# Patient Record
Sex: Male | Born: 1940 | Race: White | Hispanic: No | Marital: Married | Smoking: Never smoker
Health system: Southern US, Community
[De-identification: ages and names within clinical notes are randomized; demographics above are authoritative.]

## PROBLEM LIST (undated history)

## (undated) DIAGNOSIS — E119 Type 2 diabetes mellitus without complications: Secondary | ICD-10-CM

## (undated) DIAGNOSIS — I10 Essential (primary) hypertension: Secondary | ICD-10-CM

## (undated) DIAGNOSIS — E785 Hyperlipidemia, unspecified: Secondary | ICD-10-CM

## (undated) DIAGNOSIS — C801 Malignant (primary) neoplasm, unspecified: Secondary | ICD-10-CM

## (undated) HISTORY — PX: TONSILLECTOMY: SUR1361

## (undated) HISTORY — PX: KNEE SURGERY: SHX244

---

## 1998-01-15 ENCOUNTER — Other Ambulatory Visit: Admission: RE | Admit: 1998-01-15 | Discharge: 1998-01-15 | Payer: Self-pay

## 2000-01-19 ENCOUNTER — Other Ambulatory Visit: Admission: RE | Admit: 2000-01-19 | Discharge: 2000-01-19 | Payer: Self-pay | Admitting: Gastroenterology

## 2000-02-02 ENCOUNTER — Encounter: Admission: RE | Admit: 2000-02-02 | Discharge: 2000-02-26 | Payer: Self-pay | Admitting: *Deleted

## 2004-08-16 DIAGNOSIS — C801 Malignant (primary) neoplasm, unspecified: Secondary | ICD-10-CM

## 2004-08-16 HISTORY — PX: SUPERFICIAL LYMPH NODE BIOPSY / EXCISION: SUR127

## 2004-08-16 HISTORY — DX: Malignant (primary) neoplasm, unspecified: C80.1

## 2005-02-05 ENCOUNTER — Ambulatory Visit: Payer: Self-pay | Admitting: Unknown Physician Specialty

## 2005-02-11 ENCOUNTER — Other Ambulatory Visit: Payer: Self-pay

## 2005-02-26 ENCOUNTER — Ambulatory Visit: Payer: Self-pay | Admitting: Unknown Physician Specialty

## 2005-03-04 ENCOUNTER — Ambulatory Visit: Payer: Self-pay | Admitting: Oncology

## 2005-03-16 ENCOUNTER — Ambulatory Visit: Payer: Self-pay | Admitting: Oncology

## 2005-04-16 ENCOUNTER — Ambulatory Visit: Payer: Self-pay | Admitting: Oncology

## 2005-05-16 ENCOUNTER — Ambulatory Visit: Payer: Self-pay | Admitting: Oncology

## 2005-06-18 ENCOUNTER — Ambulatory Visit: Payer: Self-pay | Admitting: Oncology

## 2005-07-16 ENCOUNTER — Ambulatory Visit: Payer: Self-pay | Admitting: Oncology

## 2005-10-18 ENCOUNTER — Ambulatory Visit: Payer: Self-pay | Admitting: Oncology

## 2005-11-14 ENCOUNTER — Ambulatory Visit: Payer: Self-pay | Admitting: Oncology

## 2006-02-15 ENCOUNTER — Ambulatory Visit: Payer: Self-pay | Admitting: Oncology

## 2006-03-16 ENCOUNTER — Ambulatory Visit: Payer: Self-pay | Admitting: Oncology

## 2006-05-06 ENCOUNTER — Ambulatory Visit: Payer: Self-pay | Admitting: Radiation Oncology

## 2006-08-15 ENCOUNTER — Ambulatory Visit: Payer: Self-pay | Admitting: Oncology

## 2006-08-16 ENCOUNTER — Ambulatory Visit: Payer: Self-pay | Admitting: Oncology

## 2007-01-15 ENCOUNTER — Ambulatory Visit: Payer: Self-pay | Admitting: Oncology

## 2007-02-10 ENCOUNTER — Ambulatory Visit: Payer: Self-pay | Admitting: Oncology

## 2007-02-14 ENCOUNTER — Ambulatory Visit: Payer: Self-pay | Admitting: Oncology

## 2007-03-19 IMAGING — CT CT NECK WITH CONTRAST
1 of 2 series · 10 of 14 positions shown, 13 images · non-contrast
Comparison: none

REASON FOR EXAM: Rt Submandibular Mass
COMMENTS:

[Series 2: neck 3.0 b40f · axial · 0.44mm/px · z∈[+8,+262]mm · 10 of 105 slices shown, 13 images]
[im 10/105  soft-tissue]
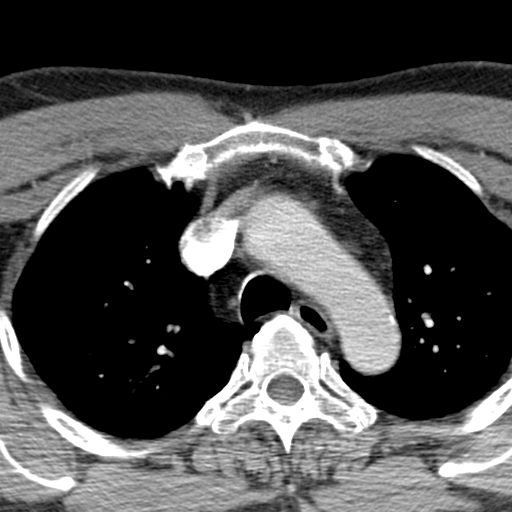
[im 10/105  bone]
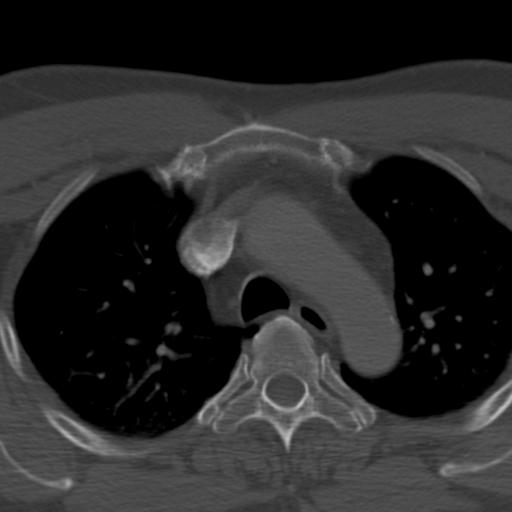
[im 19/105  bone]
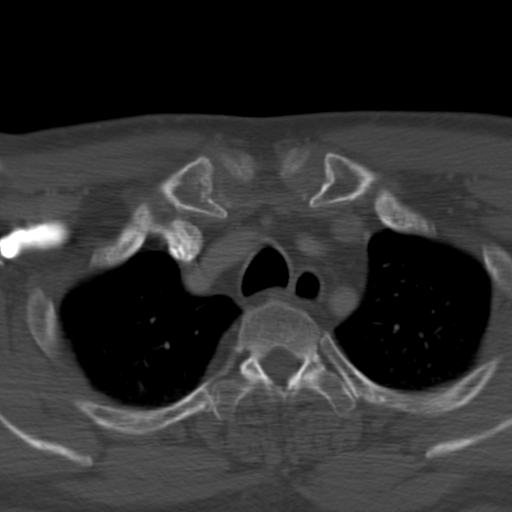
[im 29/105  bone]
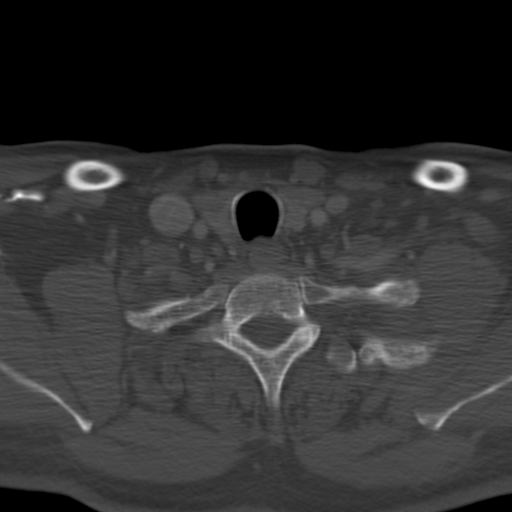
[im 38/105  bone]
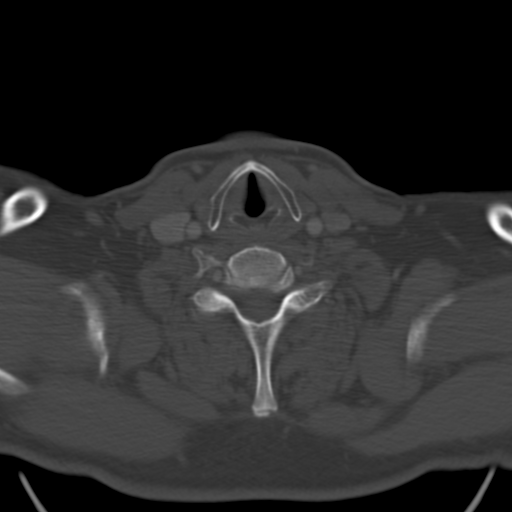
[im 48/105  soft-tissue]
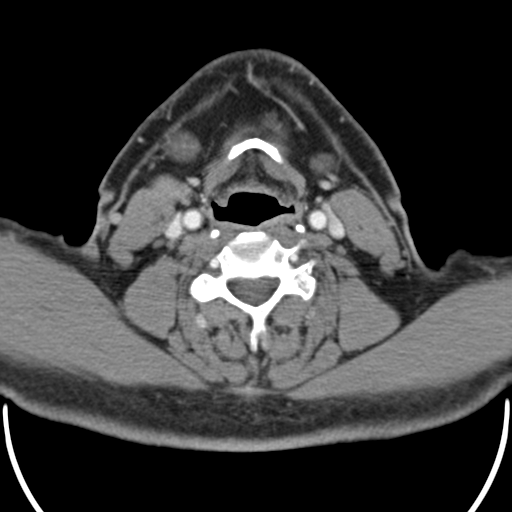
[im 48/105  bone]
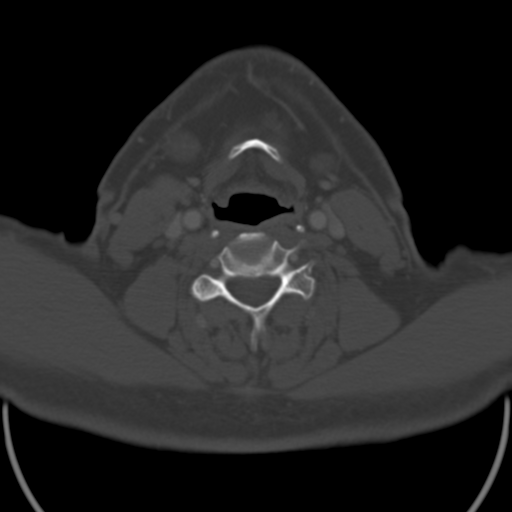
[im 57/105  bone]
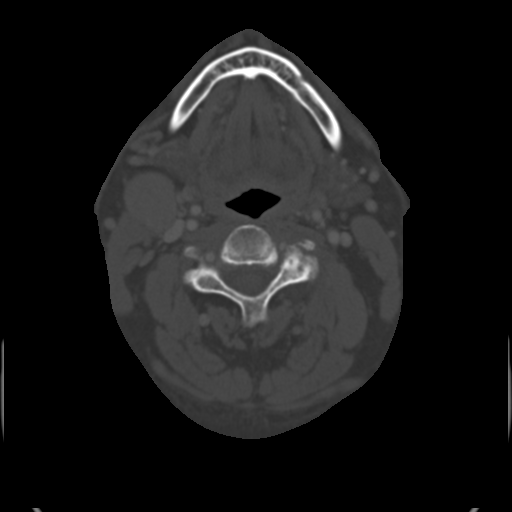
[im 67/105  bone]
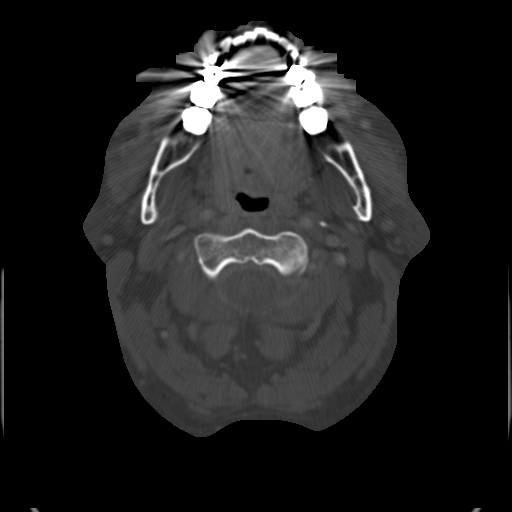
[im 76/105  bone]
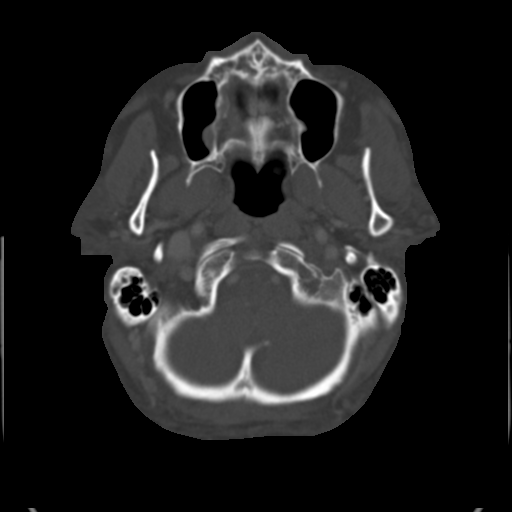
[im 86/105  soft-tissue]
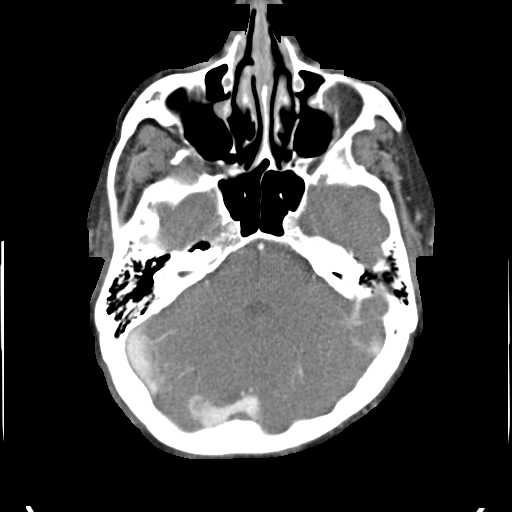
[im 86/105  bone]
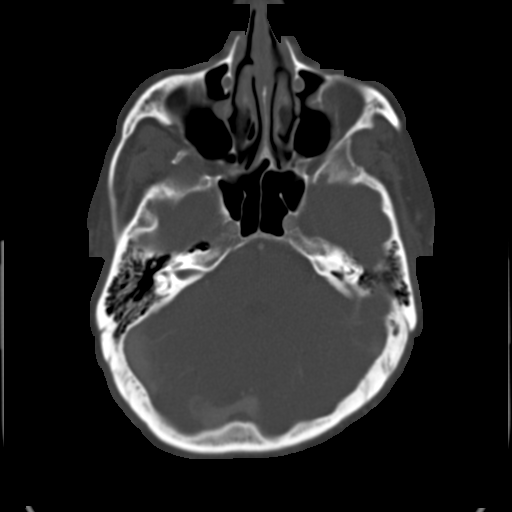
[im 95/105  bone]
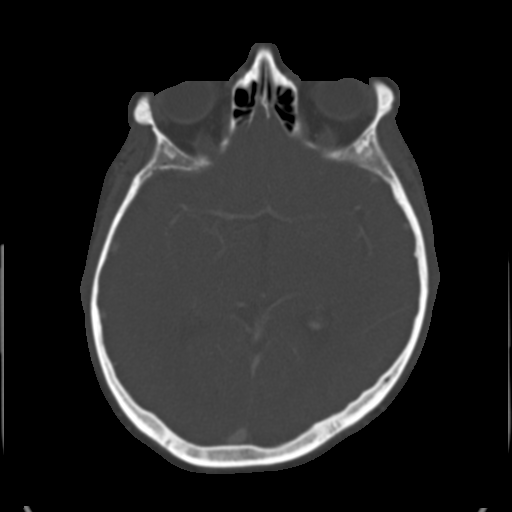

[10 of 14 positions shown; findings below may reference images not displayed]

PROCEDURE:     CT  - CT NECK WITH CONTRAST  - February 05, 2005 [DATE]

RESULT:     The patient has clinical findings worrisome for a submandibular
mass on the RIGHT.  The patient received 75 cc of Isovue 370 contrast for
this study.  A marker was placed over the lower aspect of the neck on the
RIGHT.  Deep to this the density of the skin and subcutaneous tissue is
normal.  The deeper fascial layer is normal in thickness.  However, anterior
to the anterior border of the sternocleidomastoid muscle, posterior to the
submandibular gland and lateral to the carotid and jugular vessels there is
an approximately 2.6 x 2.3 cm x approximately 3.6 cm hyperdense mass.  It is
smoothly marginated.  Fat can be seen anterior and lateral to it.  A subtle
cleft is seen between it and the anterior border of the sternocleidomastoid
muscle.  There is a small nodule measuring approximately 9 mm in diameter
anterior to this mass at the level of the angle of the mandible on the
RIGHT.  The parotid and submandibular glands are normal in density.  As
mentioned above, there is mass effect upon the posterior border of the RIGHT
submandibular gland and the retromandibular vein is seen to be stretched
over the anterior border of the abnormal mass.  As one moves inferiorly in
the RIGHT neck I do not see bulky lymphadenopathy.  There may be a few
subcentimeter nodes in the jugular chain.  On the LEFT I see no abnormal
soft tissue masses nor evidence of lymphadenopathy.

The observed portions of the paranasal sinuses appear normal. The
nasopharyngeal structures and the oropharyngeal structures also appear
normal.  No laryngeal abnormality is seen.  There is no evidence of a goiter.
IMPRESSION: 1.     There is a smoothly marginated hyperdense mass in the RIGHT lower
neck with dimensions as given above.  There is a small similar density lymph
node noted anterior to the mass at the level of the angle of the mandible.
The density of the dominant mass  is homogeneous with no finding to suggest
central necrosis.
2.     I see no abnormality of the submandibular glands.

## 2007-04-17 ENCOUNTER — Ambulatory Visit: Payer: Self-pay | Admitting: Radiation Oncology

## 2007-05-10 ENCOUNTER — Ambulatory Visit: Payer: Self-pay | Admitting: Oncology

## 2007-07-17 ENCOUNTER — Ambulatory Visit: Payer: Self-pay | Admitting: Oncology

## 2007-08-07 ENCOUNTER — Ambulatory Visit: Payer: Self-pay | Admitting: Oncology

## 2007-08-17 ENCOUNTER — Ambulatory Visit: Payer: Self-pay | Admitting: Oncology

## 2007-10-15 ENCOUNTER — Ambulatory Visit: Payer: Self-pay | Admitting: Oncology

## 2008-01-18 ENCOUNTER — Ambulatory Visit: Payer: Self-pay | Admitting: Gastroenterology

## 2008-02-07 ENCOUNTER — Ambulatory Visit: Payer: Self-pay | Admitting: Oncology

## 2008-02-14 ENCOUNTER — Ambulatory Visit: Payer: Self-pay | Admitting: Oncology

## 2008-08-16 ENCOUNTER — Ambulatory Visit: Payer: Self-pay | Admitting: Oncology

## 2008-09-02 ENCOUNTER — Ambulatory Visit: Payer: Self-pay | Admitting: Oncology

## 2008-09-16 ENCOUNTER — Ambulatory Visit: Payer: Self-pay | Admitting: Oncology

## 2009-02-13 ENCOUNTER — Ambulatory Visit: Payer: Self-pay | Admitting: Oncology

## 2009-03-03 ENCOUNTER — Ambulatory Visit: Payer: Self-pay | Admitting: Oncology

## 2009-03-16 ENCOUNTER — Ambulatory Visit: Payer: Self-pay | Admitting: Oncology

## 2009-09-16 ENCOUNTER — Ambulatory Visit: Payer: Self-pay | Admitting: Oncology

## 2009-10-02 ENCOUNTER — Ambulatory Visit: Payer: Self-pay | Admitting: Oncology

## 2009-10-14 ENCOUNTER — Ambulatory Visit: Payer: Self-pay | Admitting: Oncology

## 2010-04-08 ENCOUNTER — Emergency Department: Payer: Self-pay | Admitting: Emergency Medicine

## 2010-10-08 ENCOUNTER — Ambulatory Visit: Payer: Self-pay | Admitting: Oncology

## 2010-10-15 ENCOUNTER — Ambulatory Visit: Payer: Self-pay | Admitting: Oncology

## 2011-04-30 ENCOUNTER — Ambulatory Visit: Payer: Self-pay | Admitting: Gastroenterology

## 2011-11-04 ENCOUNTER — Ambulatory Visit: Payer: Self-pay | Admitting: Internal Medicine

## 2015-08-22 DIAGNOSIS — L989 Disorder of the skin and subcutaneous tissue, unspecified: Secondary | ICD-10-CM | POA: Diagnosis not present

## 2015-08-22 DIAGNOSIS — I10 Essential (primary) hypertension: Secondary | ICD-10-CM | POA: Diagnosis not present

## 2015-08-22 DIAGNOSIS — E7800 Pure hypercholesterolemia, unspecified: Secondary | ICD-10-CM | POA: Diagnosis not present

## 2015-08-22 DIAGNOSIS — R739 Hyperglycemia, unspecified: Secondary | ICD-10-CM | POA: Diagnosis not present

## 2015-10-28 ENCOUNTER — Ambulatory Visit
Admission: RE | Admit: 2015-10-28 | Discharge: 2015-10-28 | Disposition: A | Discharge status: Home / Self Care | Payer: PPO | Source: Ambulatory Visit | Attending: Internal Medicine | Admitting: Internal Medicine

## 2015-10-28 ENCOUNTER — Other Ambulatory Visit: Payer: Self-pay | Admitting: Internal Medicine

## 2015-10-28 DIAGNOSIS — I10 Essential (primary) hypertension: Secondary | ICD-10-CM | POA: Diagnosis not present

## 2015-10-28 DIAGNOSIS — M7989 Other specified soft tissue disorders: Secondary | ICD-10-CM | POA: Diagnosis not present

## 2015-10-28 DIAGNOSIS — M25561 Pain in right knee: Secondary | ICD-10-CM | POA: Insufficient documentation

## 2015-10-28 DIAGNOSIS — E7800 Pure hypercholesterolemia, unspecified: Secondary | ICD-10-CM | POA: Diagnosis not present

## 2015-10-28 DIAGNOSIS — M179 Osteoarthritis of knee, unspecified: Secondary | ICD-10-CM | POA: Diagnosis not present

## 2015-10-28 DIAGNOSIS — R739 Hyperglycemia, unspecified: Secondary | ICD-10-CM | POA: Diagnosis not present

## 2015-10-28 DIAGNOSIS — M7121 Synovial cyst of popliteal space [Baker], right knee: Secondary | ICD-10-CM | POA: Insufficient documentation

## 2015-11-14 DIAGNOSIS — M1711 Unilateral primary osteoarthritis, right knee: Secondary | ICD-10-CM | POA: Diagnosis not present

## 2016-02-13 DIAGNOSIS — R739 Hyperglycemia, unspecified: Secondary | ICD-10-CM | POA: Diagnosis not present

## 2016-02-13 DIAGNOSIS — E7800 Pure hypercholesterolemia, unspecified: Secondary | ICD-10-CM | POA: Diagnosis not present

## 2016-02-13 DIAGNOSIS — I10 Essential (primary) hypertension: Secondary | ICD-10-CM | POA: Diagnosis not present

## 2016-02-13 DIAGNOSIS — L989 Disorder of the skin and subcutaneous tissue, unspecified: Secondary | ICD-10-CM | POA: Diagnosis not present

## 2016-02-20 DIAGNOSIS — M7121 Synovial cyst of popliteal space [Baker], right knee: Secondary | ICD-10-CM | POA: Diagnosis not present

## 2016-02-20 DIAGNOSIS — Z0000 Encounter for general adult medical examination without abnormal findings: Secondary | ICD-10-CM | POA: Diagnosis not present

## 2016-02-20 DIAGNOSIS — E782 Mixed hyperlipidemia: Secondary | ICD-10-CM | POA: Diagnosis not present

## 2016-02-20 DIAGNOSIS — I10 Essential (primary) hypertension: Secondary | ICD-10-CM | POA: Diagnosis not present

## 2016-02-20 DIAGNOSIS — Z23 Encounter for immunization: Secondary | ICD-10-CM | POA: Diagnosis not present

## 2016-02-20 DIAGNOSIS — R739 Hyperglycemia, unspecified: Secondary | ICD-10-CM | POA: Diagnosis not present

## 2016-05-17 DIAGNOSIS — H2513 Age-related nuclear cataract, bilateral: Secondary | ICD-10-CM | POA: Diagnosis not present

## 2016-05-20 DIAGNOSIS — Z86010 Personal history of colonic polyps: Secondary | ICD-10-CM | POA: Diagnosis not present

## 2016-08-18 DIAGNOSIS — I10 Essential (primary) hypertension: Secondary | ICD-10-CM | POA: Diagnosis not present

## 2016-08-18 DIAGNOSIS — E782 Mixed hyperlipidemia: Secondary | ICD-10-CM | POA: Diagnosis not present

## 2016-08-18 DIAGNOSIS — Z0000 Encounter for general adult medical examination without abnormal findings: Secondary | ICD-10-CM | POA: Diagnosis not present

## 2016-08-18 DIAGNOSIS — R739 Hyperglycemia, unspecified: Secondary | ICD-10-CM | POA: Diagnosis not present

## 2016-08-18 DIAGNOSIS — Z23 Encounter for immunization: Secondary | ICD-10-CM | POA: Diagnosis not present

## 2016-08-18 DIAGNOSIS — M7121 Synovial cyst of popliteal space [Baker], right knee: Secondary | ICD-10-CM | POA: Diagnosis not present

## 2016-08-25 DIAGNOSIS — E7800 Pure hypercholesterolemia, unspecified: Secondary | ICD-10-CM | POA: Diagnosis not present

## 2016-08-25 DIAGNOSIS — M1711 Unilateral primary osteoarthritis, right knee: Secondary | ICD-10-CM | POA: Diagnosis not present

## 2016-08-25 DIAGNOSIS — I10 Essential (primary) hypertension: Secondary | ICD-10-CM | POA: Diagnosis not present

## 2016-08-25 DIAGNOSIS — R739 Hyperglycemia, unspecified: Secondary | ICD-10-CM | POA: Diagnosis not present

## 2016-08-30 DIAGNOSIS — M1711 Unilateral primary osteoarthritis, right knee: Secondary | ICD-10-CM | POA: Diagnosis not present

## 2016-09-09 ENCOUNTER — Encounter: Payer: Self-pay | Admitting: *Deleted

## 2016-09-10 ENCOUNTER — Ambulatory Visit: Payer: PPO | Admitting: Anesthesiology

## 2016-09-10 ENCOUNTER — Encounter
Admission: RE | Disposition: A | Discharge status: Home / Self Care | Payer: Self-pay | Source: Ambulatory Visit | Attending: Gastroenterology

## 2016-09-10 ENCOUNTER — Ambulatory Visit
Admission: RE | Admit: 2016-09-10 | Discharge: 2016-09-10 | Disposition: A | Discharge status: Home / Self Care | Payer: PPO | Source: Ambulatory Visit | Attending: Gastroenterology | Admitting: Gastroenterology

## 2016-09-10 DIAGNOSIS — K640 First degree hemorrhoids: Secondary | ICD-10-CM | POA: Insufficient documentation

## 2016-09-10 DIAGNOSIS — K5730 Diverticulosis of large intestine without perforation or abscess without bleeding: Secondary | ICD-10-CM | POA: Diagnosis not present

## 2016-09-10 DIAGNOSIS — E785 Hyperlipidemia, unspecified: Secondary | ICD-10-CM | POA: Insufficient documentation

## 2016-09-10 DIAGNOSIS — D124 Benign neoplasm of descending colon: Secondary | ICD-10-CM | POA: Diagnosis not present

## 2016-09-10 DIAGNOSIS — Z86010 Personal history of colonic polyps: Secondary | ICD-10-CM | POA: Insufficient documentation

## 2016-09-10 DIAGNOSIS — Z1211 Encounter for screening for malignant neoplasm of colon: Secondary | ICD-10-CM | POA: Diagnosis not present

## 2016-09-10 DIAGNOSIS — Z800 Family history of malignant neoplasm of digestive organs: Secondary | ICD-10-CM | POA: Diagnosis not present

## 2016-09-10 DIAGNOSIS — I10 Essential (primary) hypertension: Secondary | ICD-10-CM | POA: Diagnosis not present

## 2016-09-10 DIAGNOSIS — E119 Type 2 diabetes mellitus without complications: Secondary | ICD-10-CM | POA: Diagnosis not present

## 2016-09-10 DIAGNOSIS — K5790 Diverticulosis of intestine, part unspecified, without perforation or abscess without bleeding: Secondary | ICD-10-CM | POA: Diagnosis not present

## 2016-09-10 DIAGNOSIS — K635 Polyp of colon: Secondary | ICD-10-CM | POA: Diagnosis not present

## 2016-09-10 DIAGNOSIS — Z79899 Other long term (current) drug therapy: Secondary | ICD-10-CM | POA: Diagnosis not present

## 2016-09-10 DIAGNOSIS — K648 Other hemorrhoids: Secondary | ICD-10-CM | POA: Diagnosis not present

## 2016-09-10 HISTORY — DX: Malignant (primary) neoplasm, unspecified: C80.1

## 2016-09-10 HISTORY — DX: Hyperlipidemia, unspecified: E78.5

## 2016-09-10 HISTORY — DX: Type 2 diabetes mellitus without complications: E11.9

## 2016-09-10 HISTORY — PX: COLONOSCOPY WITH PROPOFOL: SHX5780

## 2016-09-10 HISTORY — DX: Essential (primary) hypertension: I10

## 2016-09-10 LAB — GLUCOSE, CAPILLARY: Glucose-Capillary: 117 mg/dL — ABNORMAL HIGH (ref 65–99)

## 2016-09-10 SURGERY — COLONOSCOPY WITH PROPOFOL
Anesthesia: General

## 2016-09-10 MED ORDER — SODIUM CHLORIDE 0.9 % IV SOLN
INTRAVENOUS | Status: DC
  Administered 2016-09-10: 1000 mL via INTRAVENOUS

## 2016-09-10 MED ORDER — MIDAZOLAM HCL 2 MG/2ML IJ SOLN
INTRAMUSCULAR | Status: AC
  Filled 2016-09-10: qty 2

## 2016-09-10 MED ORDER — PROPOFOL 500 MG/50ML IV EMUL
INTRAVENOUS | Status: DC | PRN
  Administered 2016-09-10: 120 ug/kg/min via INTRAVENOUS

## 2016-09-10 MED ORDER — PROPOFOL 500 MG/50ML IV EMUL
INTRAVENOUS | Status: AC
  Filled 2016-09-10: qty 50

## 2016-09-10 MED ORDER — PROPOFOL 10 MG/ML IV BOLUS
INTRAVENOUS | Status: DC | PRN
Start: 2016-09-10 — End: 2016-09-10
  Administered 2016-09-10: 10 mg via INTRAVENOUS
  Administered 2016-09-10: 20 mg via INTRAVENOUS
  Administered 2016-09-10: 40 mg via INTRAVENOUS

## 2016-09-10 MED ORDER — FENTANYL CITRATE (PF) 100 MCG/2ML IJ SOLN
INTRAMUSCULAR | Status: DC | PRN
  Administered 2016-09-10: 100 ug via INTRAVENOUS

## 2016-09-10 MED ORDER — SODIUM CHLORIDE 0.9 % IV SOLN: INTRAVENOUS | Status: DC

## 2016-09-10 MED ORDER — OXYMETAZOLINE HCL 0.05 % NA SOLN
NASAL | Status: AC
  Filled 2016-09-10: qty 15

## 2016-09-10 MED ORDER — FENTANYL CITRATE (PF) 100 MCG/2ML IJ SOLN
INTRAMUSCULAR | Status: AC
  Filled 2016-09-10: qty 2

## 2016-09-10 NOTE — Op Note (Signed)
[facility] Gastroenterology Patient Name: Bryan Cline Procedure Date: 09/10/2016 11:04 AM MRN: [id number] Account #: [id number] Date of Birth: 1941/01/11 Admit Type: Outpatient Age: 76 Room: [facility] ENDO ROOM 1 Gender: Male Note Status: Finalized Procedure:            Colonoscopy Indications:          Family history of colon cancer in multiple first-degree                        relatives, Personal history of colonic polyps Providers:            Lollie Sails, MD Referring MD:         Tracie Harrier, MD (Referring MD) Medicines:            Monitored Anesthesia Care Complications:        No immediate complications. Procedure:            Pre-Anesthesia Assessment:                       - ASA Grade Assessment: II - A patient with mild                        systemic disease.                       After obtaining informed consent, the colonoscope was                        passed under direct vision. Throughout the procedure,                        the patient's blood pressure, pulse, and oxygen                        saturations were monitored continuously. The                        Colonoscope was introduced through the anus and                        advanced to the the cecum, identified by appendiceal                        orifice and ileocecal valve. The colonoscopy was                        performed without difficulty. The colonoscopy was                        performed with moderate difficulty. The patient                        tolerated the procedure well. Findings:      A 3 mm polyp was found in the descending colon. The polyp was sessile.       The polyp was removed with a cold biopsy forceps. Resection and       retrieval were complete.      Multiple small and large-mouthed diverticula were found in the sigmoid       colon, descending colon, transverse colon and ascending colon.      Non-bleeding  internal hemorrhoids were found during  retroflexion and       during anoscopy. The hemorrhoids were small and Grade I (internal       hemorrhoids that do not prolapse).      The exam was otherwise without abnormality.      The digital rectal exam was normal. Impression:           - One 3 mm polyp in the descending colon, removed with                        a cold biopsy forceps. Resected and retrieved.                       - Diverticulosis in the sigmoid colon, in the                        descending colon, in the transverse colon and in the                        ascending colon.                       - Non-bleeding internal hemorrhoids.                       - The examination was otherwise normal. Recommendation:       - Discharge patient to home.                       - Telephone GI clinic for pathology results in 1 week. Procedure Code(s):    --- Professional ---                       [phone], Colonoscopy, flexible; with biopsy, single or                        multiple Diagnosis Code(s):    --- Professional ---                       D12.4, Benign neoplasm of descending colon                       K64.0, First degree hemorrhoids                       Z80.0, Family history of malignant neoplasm of                        digestive organs                       Z86.010, Personal history of colonic polyps                       K57.30, Diverticulosis of large intestine without                        perforation or abscess without bleeding CPT copyright 2016 American Medical Association. All rights reserved. The codes documented in this report are preliminary and upon coder review may  be revised to meet current compliance requirements. Lollie Sails, MD 09/10/2016 11:56:32 AM This report has been signed electronically. Number of  Addenda: 0 Note Initiated On: 09/10/2016 11:04 AM Scope Withdrawal Time: 0 hours 11 minutes 58 seconds  Total Procedure Duration: 0 hours 24 minutes 43 seconds       [facility]

## 2016-09-10 NOTE — Transfer of Care (Signed)
Immediate Anesthesia Transfer of Care Note  Patient: Bryan Cline  Procedure(s) Performed: Procedure(s): COLONOSCOPY WITH PROPOFOL (N/A)  Patient Location: PACU  Anesthesia Type:General  Level of Consciousness: awake  Airway & Oxygen Therapy: Patient Spontanous Breathing and Patient connected to nasal cannula oxygen  Post-op Assessment: Report given to RN and Post -op Vital signs reviewed and stable  Post vital signs: Reviewed  Last Vitals:  Vitals:   09/10/16 0954  BP: (!) 170/70  Pulse: 88  Resp: 16  Temp: 36.1 C    Last Pain:  Vitals:   09/10/16 0954  TempSrc: Tympanic         Complications: No apparent anesthesia complications

## 2016-09-10 NOTE — Anesthesia Postprocedure Evaluation (Signed)
Anesthesia Post Note  Patient: Bryan Cline  Procedure(s) Performed: Procedure(s) (LRB): COLONOSCOPY WITH PROPOFOL (N/A)  Patient location during evaluation: PACU Anesthesia Type: General Level of consciousness: awake Pain management: pain level controlled Vital Signs Assessment: post-procedure vital signs reviewed and stable Respiratory status: spontaneous breathing Cardiovascular status: stable Anesthetic complications: no     Last Vitals:  Vitals:   09/10/16 1215 09/10/16 1225  BP: 124/75 (!) 144/83  Pulse: 77 66  Resp: 16 17  Temp:      Last Pain:  Vitals:   09/10/16 1155  TempSrc: Tympanic                 VAN STAVEREN,Maxey Ransom

## 2016-09-10 NOTE — H&P (Signed)
Outpatient short stay form Pre-procedure 09/10/2016 11:12 AM Bryan Sails MD  Primary Physician: Dr Tracie Harrier  Reason for visit:  Colonoscopy  History of present illness:  Patient is a 76 year old male presenting today as above. He has personal history of adenomatous colon polyps as well as colon cancer in his father and a brother. His last colonoscopy was done 5 years ago. He tolerated his prep well. He does occasionally take a 81 mg aspirin. He takes no other aspirin products or blood thinning agents.    Current Facility-Administered Medications:  .  0.9 %  sodium chloride infusion, , Intravenous, Continuous, Bryan Sails, MD, Last Rate: 20 mL/hr at 09/10/16 1005, 1,000 mL at 09/10/16 1005 .  0.9 %  sodium chloride infusion, , Intravenous, Continuous, Bryan Sails, MD  Prescriptions Prior to Admission  Medication Sig Dispense Refill Last Dose  . amLODipine (NORVASC) 10 MG tablet Take 10 mg by mouth daily.   Past Week at Unknown time  . losartan (COZAAR) 100 MG tablet Take 100 mg by mouth daily.   Past Week at Unknown time  . meloxicam (MOBIC) 15 MG tablet Take 15 mg by mouth daily.   Past Week at Unknown time  . Multiple Vitamin (MULTIVITAMIN) tablet Take 1 tablet by mouth daily.   Past Week at Unknown time  . naproxen (NAPROSYN) 250 MG tablet Take by mouth 2 (two) times daily with a meal.   Past Week at Unknown time  . simvastatin (ZOCOR) 40 MG tablet Take 40 mg by mouth daily.   Past Week at Unknown time     No Known Allergies   Past Medical History:  Diagnosis Date  . 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 Diabetes mellitus without complication ([place])   . Hyperlipemia   . Hypertension     Review of systems:      Physical Exam    Heart and lungs: Regular rate and rhythm without rub or gallop, lungs are bilaterally clear.    HEENT: Normocephalic atraumatic eyes are anicteric    Other:     Pertinant exam for procedure: Soft nontender nondistended  bowel sounds positive normoactive.    Planned proceedures: Colonoscopy and indicated procedures. I have discussed the risks benefits and complications of procedures to include not limited to bleeding, infection, perforation and the risk of sedation and the patient wishes to proceed.    Bryan Sails, MD Gastroenterology 09/10/2016  11:12 AM

## 2016-09-10 NOTE — Anesthesia Post-op Follow-up Note (Signed)
Anesthesia QCDR form completed.        

## 2016-09-10 NOTE — Anesthesia Preprocedure Evaluation (Signed)
Anesthesia Evaluation  Patient identified by MRN, date of birth, ID band Patient awake    Reviewed: Allergy & Precautions  Airway Mallampati: II       Dental  (+) Teeth Intact   Pulmonary neg pulmonary ROS,    breath sounds clear to auscultation       Cardiovascular Exercise Tolerance: Good hypertension, Pt. on medications  Rhythm:Regular Rate:Normal     Neuro/Psych negative neurological ROS  negative psych ROS   GI/Hepatic negative GI ROS, Neg liver ROS,   Endo/Other  negative endocrine ROSdiabetes, Type 2  Renal/GU negative Renal ROS     Musculoskeletal negative musculoskeletal ROS (+)   Abdominal Normal abdominal exam  (+)   Peds negative pediatric ROS (+)  Hematology   Anesthesia Other Findings   Reproductive/Obstetrics                             Anesthesia Physical Anesthesia Plan  ASA: II  Anesthesia Plan: General   Post-op Pain Management:    Induction: Intravenous  Airway Management Planned: Natural Airway and Nasal Cannula  Additional Equipment:   Intra-op Plan:   Post-operative Plan:   Informed Consent: I have reviewed the patients History and Physical, chart, labs and discussed the procedure including the risks, benefits and alternatives for the proposed anesthesia with the patient or authorized representative who has indicated his/her understanding and acceptance.     Plan Discussed with: Surgeon  Anesthesia Plan Comments:         Anesthesia Quick Evaluation

## 2016-09-13 ENCOUNTER — Encounter: Payer: Self-pay | Admitting: Gastroenterology

## 2016-09-13 LAB — SURGICAL PATHOLOGY

## 2016-10-21 DIAGNOSIS — M1711 Unilateral primary osteoarthritis, right knee: Secondary | ICD-10-CM | POA: Diagnosis not present

## 2016-10-25 ENCOUNTER — Other Ambulatory Visit: Payer: Self-pay | Admitting: Unknown Physician Specialty

## 2016-10-25 DIAGNOSIS — M1711 Unilateral primary osteoarthritis, right knee: Secondary | ICD-10-CM

## 2016-11-05 ENCOUNTER — Ambulatory Visit
Admission: RE | Admit: 2016-11-05 | Discharge: 2016-11-05 | Disposition: A | Discharge status: Home / Self Care | Payer: PPO | Source: Ambulatory Visit | Attending: Unknown Physician Specialty | Admitting: Unknown Physician Specialty

## 2016-11-05 DIAGNOSIS — M25461 Effusion, right knee: Secondary | ICD-10-CM | POA: Diagnosis not present

## 2016-11-05 DIAGNOSIS — M7121 Synovial cyst of popliteal space [Baker], right knee: Secondary | ICD-10-CM | POA: Diagnosis not present

## 2016-11-05 DIAGNOSIS — M25761 Osteophyte, right knee: Secondary | ICD-10-CM | POA: Diagnosis not present

## 2016-11-05 DIAGNOSIS — M659 Synovitis and tenosynovitis, unspecified: Secondary | ICD-10-CM | POA: Diagnosis not present

## 2016-11-05 DIAGNOSIS — M1711 Unilateral primary osteoarthritis, right knee: Secondary | ICD-10-CM | POA: Diagnosis not present

## 2016-11-09 DIAGNOSIS — M1711 Unilateral primary osteoarthritis, right knee: Secondary | ICD-10-CM | POA: Diagnosis not present

## 2016-11-17 DIAGNOSIS — E7800 Pure hypercholesterolemia, unspecified: Secondary | ICD-10-CM | POA: Diagnosis not present

## 2016-11-17 DIAGNOSIS — I10 Essential (primary) hypertension: Secondary | ICD-10-CM | POA: Diagnosis not present

## 2016-11-17 DIAGNOSIS — I358 Other nonrheumatic aortic valve disorders: Secondary | ICD-10-CM | POA: Diagnosis not present

## 2016-11-17 DIAGNOSIS — R739 Hyperglycemia, unspecified: Secondary | ICD-10-CM | POA: Diagnosis not present

## 2016-11-17 DIAGNOSIS — Z01818 Encounter for other preprocedural examination: Secondary | ICD-10-CM | POA: Diagnosis not present

## 2016-11-17 DIAGNOSIS — M1711 Unilateral primary osteoarthritis, right knee: Secondary | ICD-10-CM | POA: Diagnosis not present

## 2016-12-27 DIAGNOSIS — D72829 Elevated white blood cell count, unspecified: Secondary | ICD-10-CM | POA: Diagnosis not present

## 2016-12-27 DIAGNOSIS — I10 Essential (primary) hypertension: Secondary | ICD-10-CM | POA: Diagnosis not present

## 2016-12-27 DIAGNOSIS — M25561 Pain in right knee: Secondary | ICD-10-CM | POA: Diagnosis not present

## 2016-12-27 DIAGNOSIS — D649 Anemia, unspecified: Secondary | ICD-10-CM | POA: Diagnosis not present

## 2016-12-27 DIAGNOSIS — E785 Hyperlipidemia, unspecified: Secondary | ICD-10-CM | POA: Diagnosis not present

## 2016-12-27 DIAGNOSIS — G8918 Other acute postprocedural pain: Secondary | ICD-10-CM | POA: Diagnosis not present

## 2016-12-27 DIAGNOSIS — M1711 Unilateral primary osteoarthritis, right knee: Secondary | ICD-10-CM | POA: Diagnosis not present

## 2016-12-27 DIAGNOSIS — R011 Cardiac murmur, unspecified: Secondary | ICD-10-CM | POA: Diagnosis not present

## 2016-12-27 DIAGNOSIS — Z8572 Personal history of non-Hodgkin lymphomas: Secondary | ICD-10-CM | POA: Diagnosis not present

## 2016-12-28 DIAGNOSIS — M25569 Pain in unspecified knee: Secondary | ICD-10-CM | POA: Diagnosis not present

## 2016-12-29 DIAGNOSIS — M1711 Unilateral primary osteoarthritis, right knee: Secondary | ICD-10-CM | POA: Diagnosis not present

## 2016-12-30 DIAGNOSIS — Z96651 Presence of right artificial knee joint: Secondary | ICD-10-CM | POA: Diagnosis not present

## 2016-12-30 DIAGNOSIS — Z471 Aftercare following joint replacement surgery: Secondary | ICD-10-CM | POA: Diagnosis not present

## 2016-12-30 DIAGNOSIS — Z8579 Personal history of other malignant neoplasms of lymphoid, hematopoietic and related tissues: Secondary | ICD-10-CM | POA: Diagnosis not present

## 2016-12-30 DIAGNOSIS — Z7901 Long term (current) use of anticoagulants: Secondary | ICD-10-CM | POA: Diagnosis not present

## 2016-12-30 DIAGNOSIS — E785 Hyperlipidemia, unspecified: Secondary | ICD-10-CM | POA: Diagnosis not present

## 2016-12-30 DIAGNOSIS — I10 Essential (primary) hypertension: Secondary | ICD-10-CM | POA: Diagnosis not present

## 2016-12-31 DIAGNOSIS — M25562 Pain in left knee: Secondary | ICD-10-CM | POA: Diagnosis not present

## 2016-12-31 DIAGNOSIS — G8929 Other chronic pain: Secondary | ICD-10-CM | POA: Diagnosis not present

## 2017-01-03 DIAGNOSIS — I10 Essential (primary) hypertension: Secondary | ICD-10-CM | POA: Diagnosis not present

## 2017-01-03 DIAGNOSIS — Z96651 Presence of right artificial knee joint: Secondary | ICD-10-CM | POA: Diagnosis not present

## 2017-01-03 DIAGNOSIS — Z8579 Personal history of other malignant neoplasms of lymphoid, hematopoietic and related tissues: Secondary | ICD-10-CM | POA: Diagnosis not present

## 2017-01-03 DIAGNOSIS — Z7901 Long term (current) use of anticoagulants: Secondary | ICD-10-CM | POA: Diagnosis not present

## 2017-01-03 DIAGNOSIS — Z471 Aftercare following joint replacement surgery: Secondary | ICD-10-CM | POA: Diagnosis not present

## 2017-01-03 DIAGNOSIS — E785 Hyperlipidemia, unspecified: Secondary | ICD-10-CM | POA: Diagnosis not present

## 2017-01-11 DIAGNOSIS — Z8579 Personal history of other malignant neoplasms of lymphoid, hematopoietic and related tissues: Secondary | ICD-10-CM | POA: Diagnosis not present

## 2017-01-11 DIAGNOSIS — I10 Essential (primary) hypertension: Secondary | ICD-10-CM | POA: Diagnosis not present

## 2017-01-11 DIAGNOSIS — Z7901 Long term (current) use of anticoagulants: Secondary | ICD-10-CM | POA: Diagnosis not present

## 2017-01-11 DIAGNOSIS — E785 Hyperlipidemia, unspecified: Secondary | ICD-10-CM | POA: Diagnosis not present

## 2017-01-11 DIAGNOSIS — Z96651 Presence of right artificial knee joint: Secondary | ICD-10-CM | POA: Diagnosis not present

## 2017-01-11 DIAGNOSIS — Z471 Aftercare following joint replacement surgery: Secondary | ICD-10-CM | POA: Diagnosis not present

## 2017-01-17 DIAGNOSIS — M25561 Pain in right knee: Secondary | ICD-10-CM | POA: Diagnosis not present

## 2017-01-17 DIAGNOSIS — M6281 Muscle weakness (generalized): Secondary | ICD-10-CM | POA: Diagnosis not present

## 2017-01-17 DIAGNOSIS — Z96651 Presence of right artificial knee joint: Secondary | ICD-10-CM | POA: Diagnosis not present

## 2017-01-17 DIAGNOSIS — M25661 Stiffness of right knee, not elsewhere classified: Secondary | ICD-10-CM | POA: Diagnosis not present

## 2017-01-19 DIAGNOSIS — M25661 Stiffness of right knee, not elsewhere classified: Secondary | ICD-10-CM | POA: Diagnosis not present

## 2017-01-19 DIAGNOSIS — M6281 Muscle weakness (generalized): Secondary | ICD-10-CM | POA: Diagnosis not present

## 2017-01-19 DIAGNOSIS — M25561 Pain in right knee: Secondary | ICD-10-CM | POA: Diagnosis not present

## 2017-01-19 DIAGNOSIS — Z96651 Presence of right artificial knee joint: Secondary | ICD-10-CM | POA: Diagnosis not present

## 2017-01-21 DIAGNOSIS — M6281 Muscle weakness (generalized): Secondary | ICD-10-CM | POA: Diagnosis not present

## 2017-01-21 DIAGNOSIS — M25661 Stiffness of right knee, not elsewhere classified: Secondary | ICD-10-CM | POA: Diagnosis not present

## 2017-01-21 DIAGNOSIS — M25561 Pain in right knee: Secondary | ICD-10-CM | POA: Diagnosis not present

## 2017-01-21 DIAGNOSIS — Z96651 Presence of right artificial knee joint: Secondary | ICD-10-CM | POA: Diagnosis not present

## 2017-01-24 DIAGNOSIS — M25561 Pain in right knee: Secondary | ICD-10-CM | POA: Diagnosis not present

## 2017-01-24 DIAGNOSIS — M6281 Muscle weakness (generalized): Secondary | ICD-10-CM | POA: Diagnosis not present

## 2017-01-24 DIAGNOSIS — Z96651 Presence of right artificial knee joint: Secondary | ICD-10-CM | POA: Diagnosis not present

## 2017-01-24 DIAGNOSIS — M25661 Stiffness of right knee, not elsewhere classified: Secondary | ICD-10-CM | POA: Diagnosis not present

## 2017-01-26 DIAGNOSIS — M25561 Pain in right knee: Secondary | ICD-10-CM | POA: Diagnosis not present

## 2017-01-26 DIAGNOSIS — M25661 Stiffness of right knee, not elsewhere classified: Secondary | ICD-10-CM | POA: Diagnosis not present

## 2017-01-26 DIAGNOSIS — M6281 Muscle weakness (generalized): Secondary | ICD-10-CM | POA: Diagnosis not present

## 2017-01-26 DIAGNOSIS — Z96651 Presence of right artificial knee joint: Secondary | ICD-10-CM | POA: Diagnosis not present

## 2017-01-27 DIAGNOSIS — R531 Weakness: Secondary | ICD-10-CM | POA: Diagnosis not present

## 2017-01-28 DIAGNOSIS — M25561 Pain in right knee: Secondary | ICD-10-CM | POA: Diagnosis not present

## 2017-01-28 DIAGNOSIS — Z96651 Presence of right artificial knee joint: Secondary | ICD-10-CM | POA: Diagnosis not present

## 2017-01-28 DIAGNOSIS — M6281 Muscle weakness (generalized): Secondary | ICD-10-CM | POA: Diagnosis not present

## 2017-01-28 DIAGNOSIS — M25661 Stiffness of right knee, not elsewhere classified: Secondary | ICD-10-CM | POA: Diagnosis not present

## 2017-01-31 DIAGNOSIS — M6281 Muscle weakness (generalized): Secondary | ICD-10-CM | POA: Diagnosis not present

## 2017-01-31 DIAGNOSIS — M25661 Stiffness of right knee, not elsewhere classified: Secondary | ICD-10-CM | POA: Diagnosis not present

## 2017-01-31 DIAGNOSIS — M25561 Pain in right knee: Secondary | ICD-10-CM | POA: Diagnosis not present

## 2017-01-31 DIAGNOSIS — Z96651 Presence of right artificial knee joint: Secondary | ICD-10-CM | POA: Diagnosis not present

## 2017-02-15 DIAGNOSIS — R739 Hyperglycemia, unspecified: Secondary | ICD-10-CM | POA: Diagnosis not present

## 2017-02-15 DIAGNOSIS — Z125 Encounter for screening for malignant neoplasm of prostate: Secondary | ICD-10-CM | POA: Diagnosis not present

## 2017-02-15 DIAGNOSIS — E7800 Pure hypercholesterolemia, unspecified: Secondary | ICD-10-CM | POA: Diagnosis not present

## 2017-02-15 DIAGNOSIS — I10 Essential (primary) hypertension: Secondary | ICD-10-CM | POA: Diagnosis not present

## 2017-02-15 DIAGNOSIS — M1711 Unilateral primary osteoarthritis, right knee: Secondary | ICD-10-CM | POA: Diagnosis not present

## 2017-02-22 DIAGNOSIS — E7800 Pure hypercholesterolemia, unspecified: Secondary | ICD-10-CM | POA: Diagnosis not present

## 2017-02-22 DIAGNOSIS — I10 Essential (primary) hypertension: Secondary | ICD-10-CM | POA: Diagnosis not present

## 2017-02-22 DIAGNOSIS — Z96651 Presence of right artificial knee joint: Secondary | ICD-10-CM | POA: Diagnosis not present

## 2017-02-22 DIAGNOSIS — M1711 Unilateral primary osteoarthritis, right knee: Secondary | ICD-10-CM | POA: Diagnosis not present

## 2017-02-22 DIAGNOSIS — Z0000 Encounter for general adult medical examination without abnormal findings: Secondary | ICD-10-CM | POA: Diagnosis not present

## 2017-02-22 DIAGNOSIS — R739 Hyperglycemia, unspecified: Secondary | ICD-10-CM | POA: Diagnosis not present

## 2017-03-16 DIAGNOSIS — Z96651 Presence of right artificial knee joint: Secondary | ICD-10-CM | POA: Diagnosis not present

## 2017-06-15 DIAGNOSIS — M25561 Pain in right knee: Secondary | ICD-10-CM | POA: Diagnosis not present

## 2017-06-15 DIAGNOSIS — Z96651 Presence of right artificial knee joint: Secondary | ICD-10-CM | POA: Diagnosis not present

## 2017-06-15 DIAGNOSIS — G8929 Other chronic pain: Secondary | ICD-10-CM | POA: Diagnosis not present

## 2017-08-17 DIAGNOSIS — M1711 Unilateral primary osteoarthritis, right knee: Secondary | ICD-10-CM | POA: Diagnosis not present

## 2017-08-17 DIAGNOSIS — E7800 Pure hypercholesterolemia, unspecified: Secondary | ICD-10-CM | POA: Diagnosis not present

## 2017-08-17 DIAGNOSIS — Z96651 Presence of right artificial knee joint: Secondary | ICD-10-CM | POA: Diagnosis not present

## 2017-08-17 DIAGNOSIS — R739 Hyperglycemia, unspecified: Secondary | ICD-10-CM | POA: Diagnosis not present

## 2017-08-17 DIAGNOSIS — Z0000 Encounter for general adult medical examination without abnormal findings: Secondary | ICD-10-CM | POA: Diagnosis not present

## 2017-08-17 DIAGNOSIS — I10 Essential (primary) hypertension: Secondary | ICD-10-CM | POA: Diagnosis not present

## 2017-08-24 DIAGNOSIS — E7800 Pure hypercholesterolemia, unspecified: Secondary | ICD-10-CM | POA: Diagnosis not present

## 2017-08-24 DIAGNOSIS — R739 Hyperglycemia, unspecified: Secondary | ICD-10-CM | POA: Diagnosis not present

## 2017-08-24 DIAGNOSIS — I10 Essential (primary) hypertension: Secondary | ICD-10-CM | POA: Diagnosis not present

## 2017-08-24 DIAGNOSIS — Z96651 Presence of right artificial knee joint: Secondary | ICD-10-CM | POA: Diagnosis not present

## 2017-08-24 DIAGNOSIS — M1711 Unilateral primary osteoarthritis, right knee: Secondary | ICD-10-CM | POA: Diagnosis not present

## 2018-01-06 DIAGNOSIS — Z96651 Presence of right artificial knee joint: Secondary | ICD-10-CM | POA: Diagnosis not present

## 2018-02-17 DIAGNOSIS — I10 Essential (primary) hypertension: Secondary | ICD-10-CM | POA: Diagnosis not present

## 2018-02-17 DIAGNOSIS — M1711 Unilateral primary osteoarthritis, right knee: Secondary | ICD-10-CM | POA: Diagnosis not present

## 2018-02-17 DIAGNOSIS — Z96651 Presence of right artificial knee joint: Secondary | ICD-10-CM | POA: Diagnosis not present

## 2018-02-17 DIAGNOSIS — E7800 Pure hypercholesterolemia, unspecified: Secondary | ICD-10-CM | POA: Diagnosis not present

## 2018-02-17 DIAGNOSIS — Z125 Encounter for screening for malignant neoplasm of prostate: Secondary | ICD-10-CM | POA: Diagnosis not present

## 2018-02-17 DIAGNOSIS — R739 Hyperglycemia, unspecified: Secondary | ICD-10-CM | POA: Diagnosis not present

## 2018-02-23 DIAGNOSIS — M1711 Unilateral primary osteoarthritis, right knee: Secondary | ICD-10-CM | POA: Diagnosis not present

## 2018-02-23 DIAGNOSIS — Z0000 Encounter for general adult medical examination without abnormal findings: Secondary | ICD-10-CM | POA: Diagnosis not present

## 2018-02-23 DIAGNOSIS — Z96651 Presence of right artificial knee joint: Secondary | ICD-10-CM | POA: Diagnosis not present

## 2018-02-23 DIAGNOSIS — I10 Essential (primary) hypertension: Secondary | ICD-10-CM | POA: Diagnosis not present

## 2018-02-23 DIAGNOSIS — E7800 Pure hypercholesterolemia, unspecified: Secondary | ICD-10-CM | POA: Diagnosis not present

## 2018-02-23 DIAGNOSIS — I358 Other nonrheumatic aortic valve disorders: Secondary | ICD-10-CM | POA: Diagnosis not present

## 2018-08-21 DIAGNOSIS — Z96651 Presence of right artificial knee joint: Secondary | ICD-10-CM | POA: Diagnosis not present

## 2018-08-21 DIAGNOSIS — I10 Essential (primary) hypertension: Secondary | ICD-10-CM | POA: Diagnosis not present

## 2018-08-21 DIAGNOSIS — E7800 Pure hypercholesterolemia, unspecified: Secondary | ICD-10-CM | POA: Diagnosis not present

## 2018-08-21 DIAGNOSIS — Z0000 Encounter for general adult medical examination without abnormal findings: Secondary | ICD-10-CM | POA: Diagnosis not present

## 2018-08-21 DIAGNOSIS — M1711 Unilateral primary osteoarthritis, right knee: Secondary | ICD-10-CM | POA: Diagnosis not present

## 2018-08-28 DIAGNOSIS — Z96651 Presence of right artificial knee joint: Secondary | ICD-10-CM | POA: Diagnosis not present

## 2018-08-28 DIAGNOSIS — I10 Essential (primary) hypertension: Secondary | ICD-10-CM | POA: Diagnosis not present

## 2018-08-28 DIAGNOSIS — M1711 Unilateral primary osteoarthritis, right knee: Secondary | ICD-10-CM | POA: Diagnosis not present

## 2018-08-28 DIAGNOSIS — R739 Hyperglycemia, unspecified: Secondary | ICD-10-CM | POA: Diagnosis not present

## 2018-08-28 DIAGNOSIS — R61 Generalized hyperhidrosis: Secondary | ICD-10-CM | POA: Diagnosis not present

## 2018-12-17 IMAGING — MR MR KNEE*R* W/O CM
6 series · 34 of 40 positions shown · non-contrast
Comparison: None.

CLINICAL DATA: Medial knee pain for 1-2 years.  No known injury.

EXAM:
MRI OF THE RIGHT KNEE WITHOUT CONTRAST
TECHNIQUE: Multiplanar, multisequence MR imaging of the knee was performed. No
intravenous contrast was administered.

[Series 3: PD fat-sat · axial · 3.0mm · 0.50mm/px · z∈[-57,+62]mm · 9 of 37 slices shown (1 of 4)]
[im 1/37]
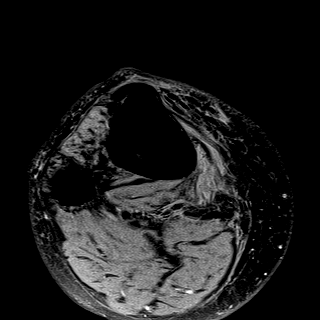
[im 5/37]
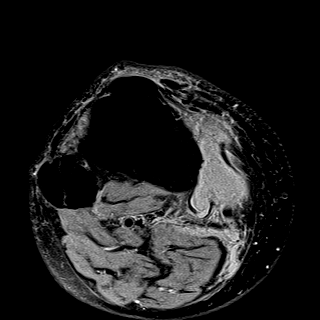
[im 10/37]
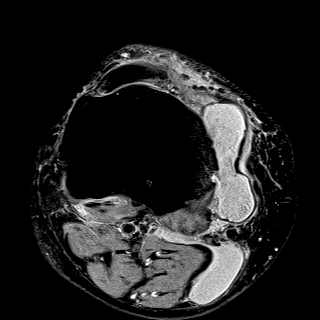
[im 14/37]
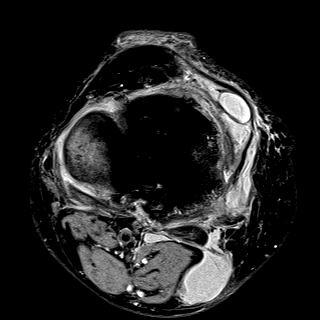
[im 19/37]
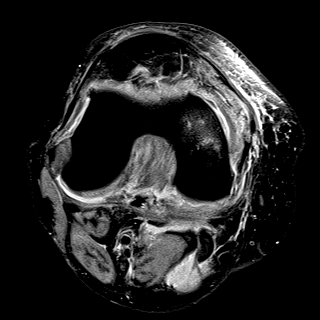
[im 23/37]
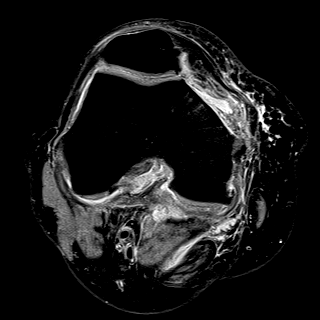
[im 28/37]
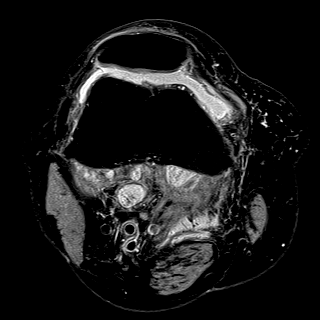
[im 32/37]
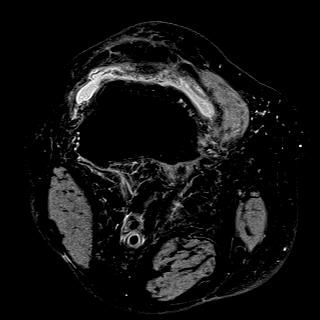
[im 37/37]
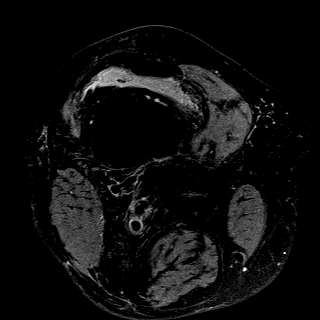

[Series 4: T1 · coronal · 3.0mm · 0.50mm/px · 1 of 33 slices shown]
[im 1/33]
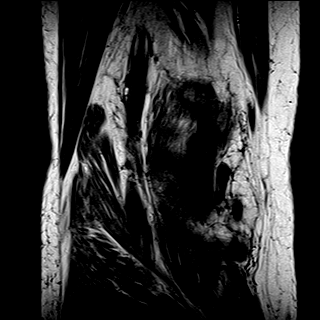

[Series 5: T2 fat-sat · coronal · 3.0mm · 0.31mm/px · 7 of 33 slices shown]
[im 1/33]
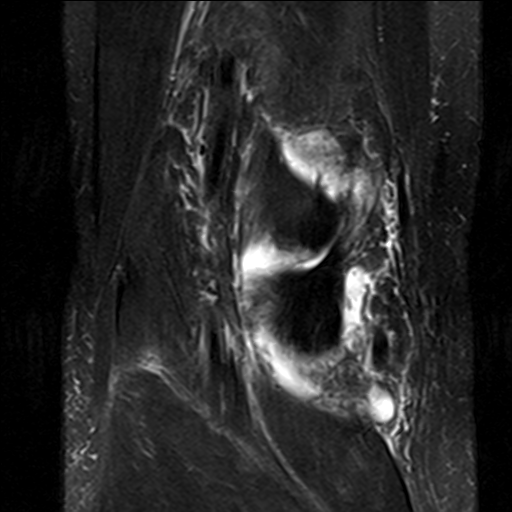
[im 6/33]
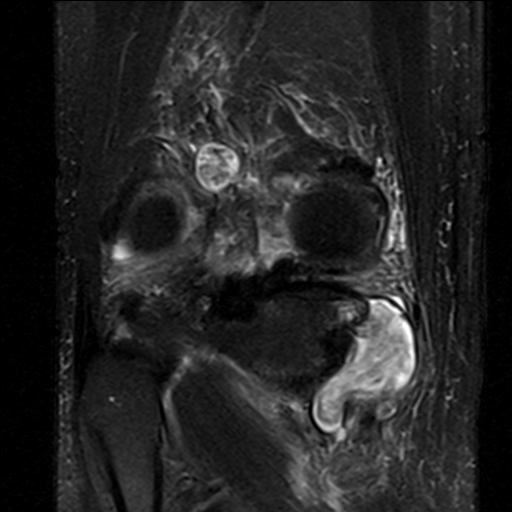
[im 11/33]
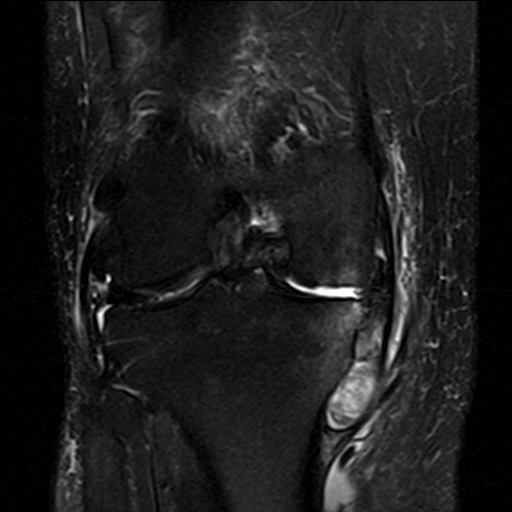
[im 17/33]
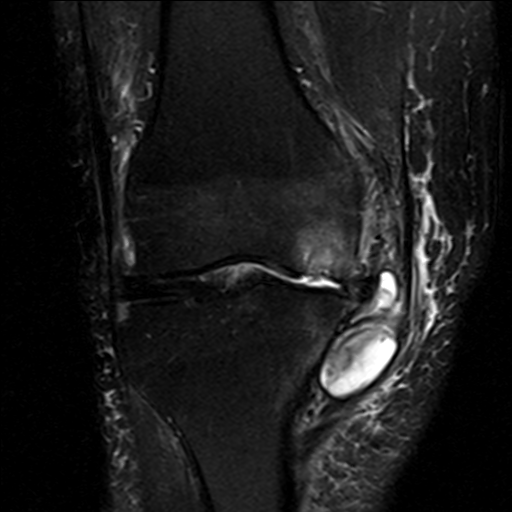
[im 22/33]
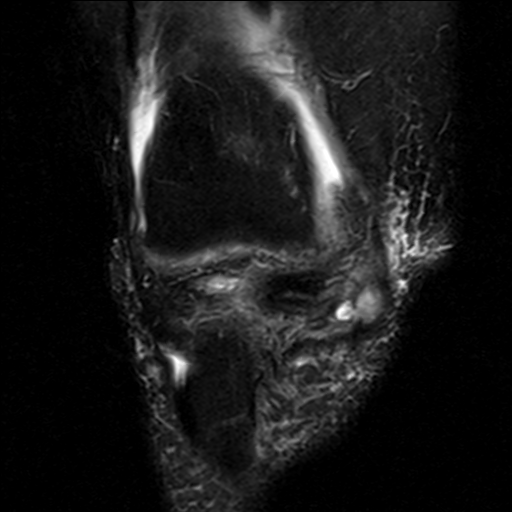
[im 27/33]
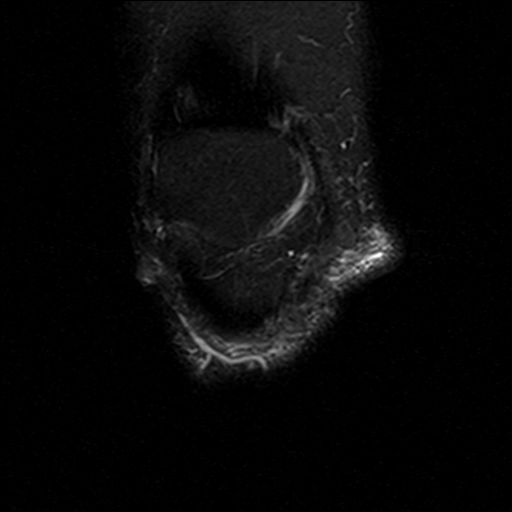
[im 33/33]
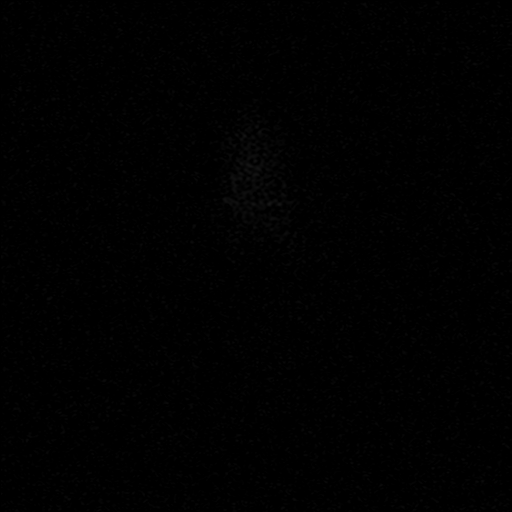

[Series 6: PD fat-sat · sagittal · 3.0mm · 0.50mm/px · 8 of 37 slices shown (2 of 4)]
[im 1/37]
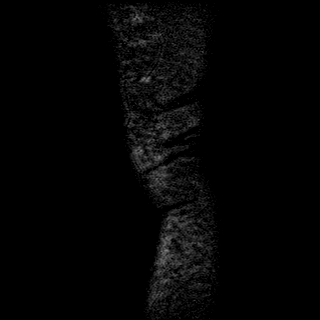
[im 6/37]
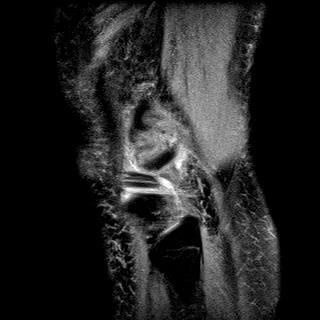
[im 11/37]
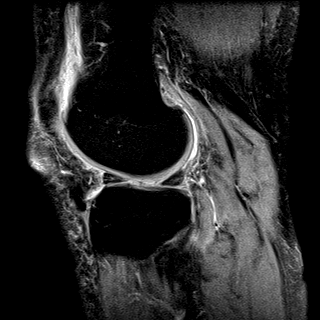
[im 16/37]
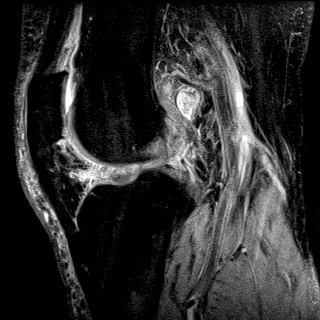
[im 21/37]
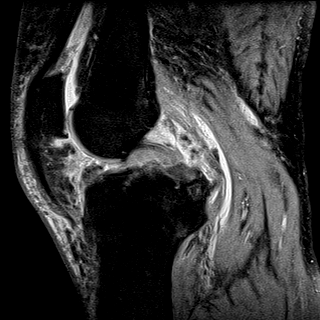
[im 26/37]
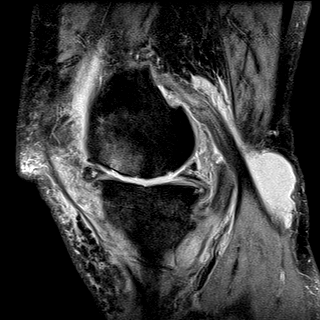
[im 31/37]
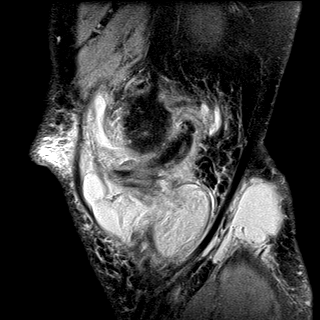
[im 37/37]
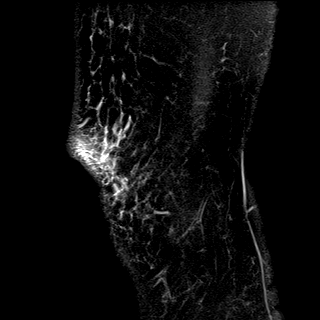

[Series 7: PD fat-sat · coronal · 3.0mm · 0.50mm/px · 7 of 33 slices shown (3 of 4)]
[im 1/33]
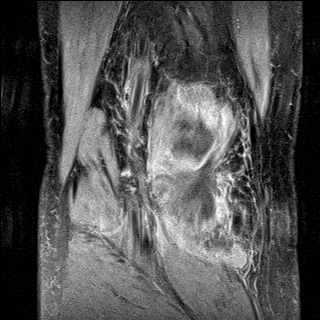
[im 6/33]
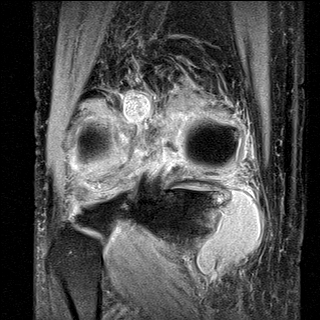
[im 11/33]
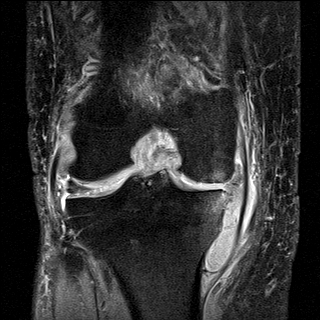
[im 17/33]
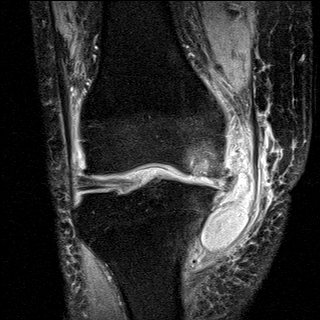
[im 22/33]
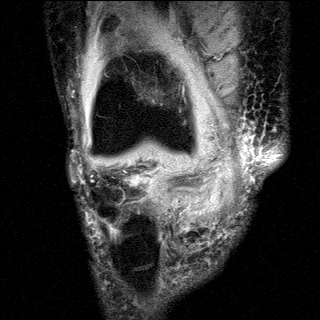
[im 27/33]
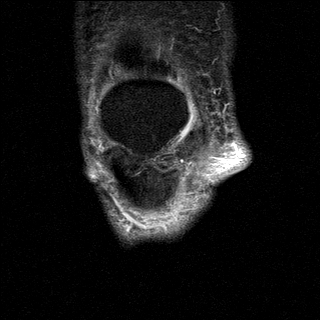
[im 33/33]
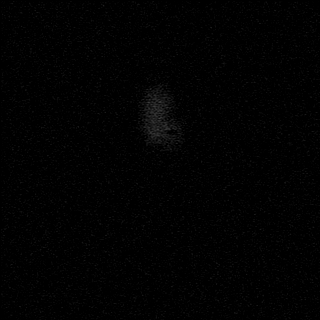

[Series 8: PD fat-sat · oblique · 2.0mm · 0.62mm/px · 2 of 11 slices shown (4 of 4)]
[im 1/11]
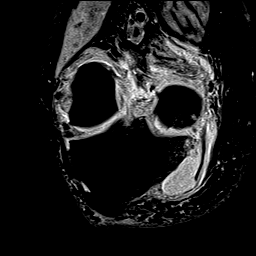
[im 11/11]
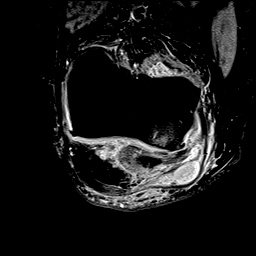

[34 of 40 positions shown; findings below may reference images not displayed]

FINDINGS: MENISCI

Medial meniscus: Maceration of the body of the medial meniscus.
Severe complex is of the anterior and posterior horn of the lateral
meniscus.

Lateral meniscus: Increased signal in the body of the lateral
meniscus consistent with severe degeneration.

LIGAMENTS

Cruciates: Intact ACL with severe expansion and increased signal
consistent with mucinous degeneration. Intact PCL increased in
signal and mildly expanded likely reflecting mucinous degeneration.

Collaterals: Medial collateral ligament is intact. Lateral
collateral ligament complex is intact.

CARTILAGE

Patellofemoral: Partial-thickness cartilage loss of the
patellofemoral compartment.

Medial: Extensive full-thickness cartilage loss of the medial
femorotibial compartment with subchondral reactive marrow edema and
marginal osteophytes.

Lateral: Partial-thickness cartilage loss of the lateral
femorotibial compartment most severe along the lateral tibial
plateau.

Joint: Large joint effusion with synovitis. Edema in Hoffa's fat. No
plical thickening.

Popliteal Fossa:  Moderate size Baker cyst.

Extensor Mechanism: Intact quadriceps tendon. Mild tendinosis of the
distal patellar tendon. Intact medial and lateral patellar
retinaculum. Intact MPFL.

Bones: No other marrow signal abnormality. No fracture or
dislocation.

Other: Multiloculated fluid collection deep to the MCL with
intermediate signal material within the fluid collection with the
superior aspect of adjacent to the macerated body of the medial
meniscus.
IMPRESSION: 1. Tricompartmental cartilage abnormalities most severe in the
medial femorotibial compartment.
2. Intact ACL with severe mucinous degeneration.
3. Macerated body of the medial meniscus. Complex tear of the
anterior and posterior horns of the medial meniscus.
4. Large joint effusion with synovitis.
5. Moderate size Baker cyst.
6. Multiloculated fluid collection deep to the MCL with intermediate
signal material within the fluid collection with the superior aspect
of adjacent to the macerated body of the medial meniscus.
Differential considerations include a large parameniscal cyst versus
severe MCL bursitis.

## 2019-02-21 DIAGNOSIS — M1711 Unilateral primary osteoarthritis, right knee: Secondary | ICD-10-CM | POA: Diagnosis not present

## 2019-02-21 DIAGNOSIS — Z96651 Presence of right artificial knee joint: Secondary | ICD-10-CM | POA: Diagnosis not present

## 2019-02-21 DIAGNOSIS — R739 Hyperglycemia, unspecified: Secondary | ICD-10-CM | POA: Diagnosis not present

## 2019-02-21 DIAGNOSIS — Z125 Encounter for screening for malignant neoplasm of prostate: Secondary | ICD-10-CM | POA: Diagnosis not present

## 2019-02-21 DIAGNOSIS — R61 Generalized hyperhidrosis: Secondary | ICD-10-CM | POA: Diagnosis not present

## 2019-02-21 DIAGNOSIS — I10 Essential (primary) hypertension: Secondary | ICD-10-CM | POA: Diagnosis not present

## 2019-02-28 DIAGNOSIS — I358 Other nonrheumatic aortic valve disorders: Secondary | ICD-10-CM | POA: Diagnosis not present

## 2019-02-28 DIAGNOSIS — Z96651 Presence of right artificial knee joint: Secondary | ICD-10-CM | POA: Diagnosis not present

## 2019-02-28 DIAGNOSIS — I10 Essential (primary) hypertension: Secondary | ICD-10-CM | POA: Diagnosis not present

## 2019-02-28 DIAGNOSIS — R739 Hyperglycemia, unspecified: Secondary | ICD-10-CM | POA: Diagnosis not present

## 2019-02-28 DIAGNOSIS — E7800 Pure hypercholesterolemia, unspecified: Secondary | ICD-10-CM | POA: Diagnosis not present

## 2019-02-28 DIAGNOSIS — M1711 Unilateral primary osteoarthritis, right knee: Secondary | ICD-10-CM | POA: Diagnosis not present

## 2019-02-28 DIAGNOSIS — Z0000 Encounter for general adult medical examination without abnormal findings: Secondary | ICD-10-CM | POA: Diagnosis not present

## 2019-08-27 DIAGNOSIS — M1711 Unilateral primary osteoarthritis, right knee: Secondary | ICD-10-CM | POA: Diagnosis not present

## 2019-08-27 DIAGNOSIS — Z96651 Presence of right artificial knee joint: Secondary | ICD-10-CM | POA: Diagnosis not present

## 2019-08-27 DIAGNOSIS — R739 Hyperglycemia, unspecified: Secondary | ICD-10-CM | POA: Diagnosis not present

## 2019-08-27 DIAGNOSIS — Z0000 Encounter for general adult medical examination without abnormal findings: Secondary | ICD-10-CM | POA: Diagnosis not present

## 2019-08-27 DIAGNOSIS — I358 Other nonrheumatic aortic valve disorders: Secondary | ICD-10-CM | POA: Diagnosis not present

## 2019-08-27 DIAGNOSIS — E7800 Pure hypercholesterolemia, unspecified: Secondary | ICD-10-CM | POA: Diagnosis not present

## 2019-08-27 DIAGNOSIS — Z125 Encounter for screening for malignant neoplasm of prostate: Secondary | ICD-10-CM | POA: Diagnosis not present

## 2019-09-03 DIAGNOSIS — Z96651 Presence of right artificial knee joint: Secondary | ICD-10-CM | POA: Diagnosis not present

## 2019-09-03 DIAGNOSIS — R011 Cardiac murmur, unspecified: Secondary | ICD-10-CM | POA: Diagnosis not present

## 2019-09-03 DIAGNOSIS — Z0000 Encounter for general adult medical examination without abnormal findings: Secondary | ICD-10-CM | POA: Diagnosis not present

## 2019-09-03 DIAGNOSIS — I10 Essential (primary) hypertension: Secondary | ICD-10-CM | POA: Diagnosis not present

## 2019-09-03 DIAGNOSIS — R42 Dizziness and giddiness: Secondary | ICD-10-CM | POA: Diagnosis not present

## 2019-09-03 DIAGNOSIS — R739 Hyperglycemia, unspecified: Secondary | ICD-10-CM | POA: Diagnosis not present

## 2019-09-17 DIAGNOSIS — I6523 Occlusion and stenosis of bilateral carotid arteries: Secondary | ICD-10-CM | POA: Diagnosis not present

## 2019-09-17 DIAGNOSIS — R011 Cardiac murmur, unspecified: Secondary | ICD-10-CM | POA: Diagnosis not present

## 2019-09-17 DIAGNOSIS — R42 Dizziness and giddiness: Secondary | ICD-10-CM | POA: Diagnosis not present

## 2020-02-25 DIAGNOSIS — Z96651 Presence of right artificial knee joint: Secondary | ICD-10-CM | POA: Diagnosis not present

## 2020-02-25 DIAGNOSIS — R739 Hyperglycemia, unspecified: Secondary | ICD-10-CM | POA: Diagnosis not present

## 2020-02-25 DIAGNOSIS — I10 Essential (primary) hypertension: Secondary | ICD-10-CM | POA: Diagnosis not present

## 2020-02-25 DIAGNOSIS — R42 Dizziness and giddiness: Secondary | ICD-10-CM | POA: Diagnosis not present

## 2020-03-03 DIAGNOSIS — I10 Essential (primary) hypertension: Secondary | ICD-10-CM | POA: Diagnosis not present

## 2020-03-03 DIAGNOSIS — E875 Hyperkalemia: Secondary | ICD-10-CM | POA: Diagnosis not present

## 2020-03-03 DIAGNOSIS — E7800 Pure hypercholesterolemia, unspecified: Secondary | ICD-10-CM | POA: Diagnosis not present

## 2020-03-03 DIAGNOSIS — R739 Hyperglycemia, unspecified: Secondary | ICD-10-CM | POA: Diagnosis not present

## 2020-03-03 DIAGNOSIS — Z96651 Presence of right artificial knee joint: Secondary | ICD-10-CM | POA: Diagnosis not present

## 2020-03-03 DIAGNOSIS — Z0000 Encounter for general adult medical examination without abnormal findings: Secondary | ICD-10-CM | POA: Diagnosis not present

## 2020-03-03 DIAGNOSIS — M1711 Unilateral primary osteoarthritis, right knee: Secondary | ICD-10-CM | POA: Diagnosis not present

## 2020-08-27 DIAGNOSIS — E7800 Pure hypercholesterolemia, unspecified: Secondary | ICD-10-CM | POA: Diagnosis not present

## 2020-08-27 DIAGNOSIS — R739 Hyperglycemia, unspecified: Secondary | ICD-10-CM | POA: Diagnosis not present

## 2020-08-27 DIAGNOSIS — I10 Essential (primary) hypertension: Secondary | ICD-10-CM | POA: Diagnosis not present

## 2020-08-27 DIAGNOSIS — Z96651 Presence of right artificial knee joint: Secondary | ICD-10-CM | POA: Diagnosis not present

## 2020-08-27 DIAGNOSIS — Z0000 Encounter for general adult medical examination without abnormal findings: Secondary | ICD-10-CM | POA: Diagnosis not present

## 2020-08-27 DIAGNOSIS — M1711 Unilateral primary osteoarthritis, right knee: Secondary | ICD-10-CM | POA: Diagnosis not present

## 2020-08-27 DIAGNOSIS — Z125 Encounter for screening for malignant neoplasm of prostate: Secondary | ICD-10-CM | POA: Diagnosis not present

## 2020-08-27 DIAGNOSIS — E875 Hyperkalemia: Secondary | ICD-10-CM | POA: Diagnosis not present

## 2020-09-03 DIAGNOSIS — Z96651 Presence of right artificial knee joint: Secondary | ICD-10-CM | POA: Diagnosis not present

## 2020-09-03 DIAGNOSIS — E7800 Pure hypercholesterolemia, unspecified: Secondary | ICD-10-CM | POA: Diagnosis not present

## 2020-09-03 DIAGNOSIS — R739 Hyperglycemia, unspecified: Secondary | ICD-10-CM | POA: Diagnosis not present

## 2020-09-03 DIAGNOSIS — I358 Other nonrheumatic aortic valve disorders: Secondary | ICD-10-CM | POA: Diagnosis not present

## 2020-11-15 DIAGNOSIS — M545 Low back pain, unspecified: Secondary | ICD-10-CM | POA: Diagnosis not present

## 2021-02-26 DIAGNOSIS — E7800 Pure hypercholesterolemia, unspecified: Secondary | ICD-10-CM | POA: Diagnosis not present

## 2021-02-26 DIAGNOSIS — Z96651 Presence of right artificial knee joint: Secondary | ICD-10-CM | POA: Diagnosis not present

## 2021-02-26 DIAGNOSIS — I358 Other nonrheumatic aortic valve disorders: Secondary | ICD-10-CM | POA: Diagnosis not present

## 2021-02-26 DIAGNOSIS — R739 Hyperglycemia, unspecified: Secondary | ICD-10-CM | POA: Diagnosis not present

## 2021-03-05 DIAGNOSIS — Z96651 Presence of right artificial knee joint: Secondary | ICD-10-CM | POA: Diagnosis not present

## 2021-03-05 DIAGNOSIS — E7800 Pure hypercholesterolemia, unspecified: Secondary | ICD-10-CM | POA: Diagnosis not present

## 2021-03-05 DIAGNOSIS — I358 Other nonrheumatic aortic valve disorders: Secondary | ICD-10-CM | POA: Diagnosis not present

## 2021-03-05 DIAGNOSIS — Z0000 Encounter for general adult medical examination without abnormal findings: Secondary | ICD-10-CM | POA: Diagnosis not present

## 2021-03-05 DIAGNOSIS — M6289 Other specified disorders of muscle: Secondary | ICD-10-CM | POA: Diagnosis not present

## 2021-03-05 DIAGNOSIS — C9030 Solitary plasmacytoma not having achieved remission: Secondary | ICD-10-CM | POA: Diagnosis not present

## 2021-03-05 DIAGNOSIS — R7309 Other abnormal glucose: Secondary | ICD-10-CM | POA: Diagnosis not present

## 2021-08-28 DIAGNOSIS — H2513 Age-related nuclear cataract, bilateral: Secondary | ICD-10-CM | POA: Diagnosis not present

## 2021-08-28 DIAGNOSIS — H47293 Other optic atrophy, bilateral: Secondary | ICD-10-CM | POA: Diagnosis not present

## 2021-09-03 DIAGNOSIS — Z0000 Encounter for general adult medical examination without abnormal findings: Secondary | ICD-10-CM | POA: Diagnosis not present

## 2021-09-03 DIAGNOSIS — E7800 Pure hypercholesterolemia, unspecified: Secondary | ICD-10-CM | POA: Diagnosis not present

## 2021-09-03 DIAGNOSIS — I358 Other nonrheumatic aortic valve disorders: Secondary | ICD-10-CM | POA: Diagnosis not present

## 2021-09-03 DIAGNOSIS — Z96651 Presence of right artificial knee joint: Secondary | ICD-10-CM | POA: Diagnosis not present

## 2021-09-03 DIAGNOSIS — R7309 Other abnormal glucose: Secondary | ICD-10-CM | POA: Diagnosis not present

## 2021-09-03 DIAGNOSIS — Z125 Encounter for screening for malignant neoplasm of prostate: Secondary | ICD-10-CM | POA: Diagnosis not present

## 2021-09-03 DIAGNOSIS — M6289 Other specified disorders of muscle: Secondary | ICD-10-CM | POA: Diagnosis not present

## 2021-09-10 DIAGNOSIS — Z96651 Presence of right artificial knee joint: Secondary | ICD-10-CM | POA: Diagnosis not present

## 2021-09-10 DIAGNOSIS — R739 Hyperglycemia, unspecified: Secondary | ICD-10-CM | POA: Diagnosis not present

## 2021-09-10 DIAGNOSIS — C9030 Solitary plasmacytoma not having achieved remission: Secondary | ICD-10-CM | POA: Diagnosis not present

## 2021-09-10 DIAGNOSIS — M1711 Unilateral primary osteoarthritis, right knee: Secondary | ICD-10-CM | POA: Diagnosis not present

## 2021-09-10 DIAGNOSIS — E7800 Pure hypercholesterolemia, unspecified: Secondary | ICD-10-CM | POA: Diagnosis not present

## 2021-09-22 DIAGNOSIS — H2512 Age-related nuclear cataract, left eye: Secondary | ICD-10-CM | POA: Diagnosis not present

## 2021-09-24 NOTE — Discharge Instructions (Signed)

## 2021-09-29 ENCOUNTER — Other Ambulatory Visit: Payer: Self-pay

## 2021-09-29 ENCOUNTER — Ambulatory Visit: Payer: PPO | Admitting: Anesthesiology

## 2021-09-29 ENCOUNTER — Encounter
Admission: RE | Disposition: A | Discharge status: Home / Self Care | Payer: Self-pay | Source: Home / Self Care | Attending: Ophthalmology

## 2021-09-29 ENCOUNTER — Ambulatory Visit
Admission: RE | Admit: 2021-09-29 | Discharge: 2021-09-29 | Disposition: A | Discharge status: Home / Self Care | Payer: PPO | Attending: Ophthalmology | Admitting: Ophthalmology

## 2021-09-29 DIAGNOSIS — E1136 Type 2 diabetes mellitus with diabetic cataract: Secondary | ICD-10-CM | POA: Diagnosis not present

## 2021-09-29 DIAGNOSIS — E119 Type 2 diabetes mellitus without complications: Secondary | ICD-10-CM | POA: Insufficient documentation

## 2021-09-29 DIAGNOSIS — H2512 Age-related nuclear cataract, left eye: Secondary | ICD-10-CM | POA: Insufficient documentation

## 2021-09-29 DIAGNOSIS — Z79899 Other long term (current) drug therapy: Secondary | ICD-10-CM | POA: Diagnosis not present

## 2021-09-29 DIAGNOSIS — Z791 Long term (current) use of non-steroidal anti-inflammatories (NSAID): Secondary | ICD-10-CM | POA: Diagnosis not present

## 2021-09-29 DIAGNOSIS — E785 Hyperlipidemia, unspecified: Secondary | ICD-10-CM | POA: Diagnosis not present

## 2021-09-29 DIAGNOSIS — I10 Essential (primary) hypertension: Secondary | ICD-10-CM | POA: Diagnosis not present

## 2021-09-29 DIAGNOSIS — H25812 Combined forms of age-related cataract, left eye: Secondary | ICD-10-CM | POA: Diagnosis not present

## 2021-09-29 HISTORY — PX: CATARACT EXTRACTION W/PHACO: SHX586

## 2021-09-29 SURGERY — PHACOEMULSIFICATION, CATARACT, WITH IOL INSERTION
Anesthesia: Monitor Anesthesia Care | Site: Eye | Laterality: Left

## 2021-09-29 MED ORDER — LACTATED RINGERS IV SOLN: INTRAVENOUS | Status: DC

## 2021-09-29 MED ORDER — SIGHTPATH DOSE#1 NA HYALUR & NA CHOND-NA HYALUR IO KIT
PACK | INTRAOCULAR | Status: DC | PRN
  Administered 2021-09-29: 1 via OPHTHALMIC

## 2021-09-29 MED ORDER — LIDOCAINE HCL (PF) 2 % IJ SOLN
INTRAOCULAR | Status: DC | PRN
  Administered 2021-09-29: 1 mL via INTRAOCULAR

## 2021-09-29 MED ORDER — FENTANYL CITRATE (PF) 100 MCG/2ML IJ SOLN
INTRAMUSCULAR | Status: DC | PRN
  Administered 2021-09-29 (×2): 50 ug via INTRAVENOUS

## 2021-09-29 MED ORDER — TETRACAINE 0.5 % OP SOLN OPTIME - NO CHARGE
OPHTHALMIC | Status: DC | PRN
  Administered 2021-09-29: 1 [drp] via OPHTHALMIC

## 2021-09-29 MED ORDER — TETRACAINE HCL 0.5 % OP SOLN
1.0000 [drp] | OPHTHALMIC | Status: DC | PRN
  Administered 2021-09-29 (×3): 1 [drp] via OPHTHALMIC

## 2021-09-29 MED ORDER — SIGHTPATH DOSE#1 BSS IO SOLN
INTRAOCULAR | Status: DC | PRN
  Administered 2021-09-29: 15 mL

## 2021-09-29 MED ORDER — BRIMONIDINE TARTRATE-TIMOLOL 0.2-0.5 % OP SOLN
OPHTHALMIC | Status: DC | PRN
  Administered 2021-09-29: 1 [drp] via OPHTHALMIC

## 2021-09-29 MED ORDER — MOXIFLOXACIN HCL 0.5 % OP SOLN
OPHTHALMIC | Status: DC | PRN
  Administered 2021-09-29: 0.2 mL via OPHTHALMIC

## 2021-09-29 MED ORDER — SIGHTPATH DOSE#1 BSS IO SOLN
INTRAOCULAR | Status: DC | PRN
  Administered 2021-09-29: 119 mL via OPHTHALMIC

## 2021-09-29 MED ORDER — MIDAZOLAM HCL 2 MG/2ML IJ SOLN
INTRAMUSCULAR | Status: DC | PRN
  Administered 2021-09-29 (×2): 1 mg via INTRAVENOUS

## 2021-09-29 MED ORDER — ARMC OPHTHALMIC DILATING DROPS
1.0000 "application " | OPHTHALMIC | Status: DC | PRN
  Administered 2021-09-29 (×3): 1 via OPHTHALMIC

## 2021-09-29 SURGICAL SUPPLY — 15 items
CATARACT SUITE SIGHTPATH (MISCELLANEOUS) ×2 IMPLANT
DISSECTOR HYDRO NUCLEUS 50X22 (MISCELLANEOUS) ×2 IMPLANT
DRSG TEGADERM 2-3/8X2-3/4 SM (GAUZE/BANDAGES/DRESSINGS) ×2 IMPLANT
FEE CATARACT SUITE SIGHTPATH (MISCELLANEOUS) ×1 IMPLANT
GLOVE SURG GAMMEX PI TX LF 7.5 (GLOVE) ×2 IMPLANT
GLOVE SURG SYN 8.5  E (GLOVE) ×2
GLOVE SURG SYN 8.5 E (GLOVE) ×1 IMPLANT
GLOVE SURG SYN 8.5 PF PI (GLOVE) ×1 IMPLANT
LENS IOL TECNIS EYHANCE 18.5 (Intraocular Lens) ×1 IMPLANT
NDL FILTER BLUNT 18X1 1/2 (NEEDLE) ×1 IMPLANT
NEEDLE FILTER BLUNT 18X 1/2SAF (NEEDLE) ×1
NEEDLE FILTER BLUNT 18X1 1/2 (NEEDLE) ×1 IMPLANT
SYR 3ML LL SCALE MARK (SYRINGE) ×2 IMPLANT
SYR 5ML LL (SYRINGE) ×2 IMPLANT
WATER STERILE IRR 250ML POUR (IV SOLUTION) ×2 IMPLANT

## 2021-09-29 NOTE — Anesthesia Preprocedure Evaluation (Addendum)
Anesthesia Evaluation  Patient identified by MRN, date of birth, ID band Patient awake    Airway Mallampati: II  TM Distance: >3 FB Neck ROM: Full    Dental  (+)    Pulmonary neg pulmonary ROS,    Pulmonary exam normal        Cardiovascular hypertension, Normal cardiovascular exam+ dysrhythmias (H/o RBBB)      Neuro/Psych negative neurological ROS  negative psych ROS   GI/Hepatic negative GI ROS, Neg liver ROS,   Endo/Other  diabetes  Renal/GU      Musculoskeletal negative musculoskeletal ROS (+)   Abdominal Normal abdominal exam  (+)   Peds negative pediatric ROS (+)  Hematology negative hematology ROS (+)   Anesthesia Other Findings   Reproductive/Obstetrics                             Anesthesia Physical Anesthesia Plan  ASA: 3  Anesthesia Plan: MAC   Post-op Pain Management:    Induction: Intravenous  PONV Risk Score and Plan: 1 and TIVA and Midazolam  Airway Management Planned: Natural Airway and Nasal Cannula  Additional Equipment: None  Intra-op Plan:   Post-operative Plan:   Informed Consent: I have reviewed the patients History and Physical, chart, labs and discussed the procedure including the risks, benefits and alternatives for the proposed anesthesia with the patient or authorized representative who has indicated his/her understanding and acceptance.     Dental advisory given  Plan Discussed with: CRNA  Anesthesia Plan Comments: (There was concern for an irregular heart beat in preop. Patient denied chest pain or palpitations. H/o of BBB in record. EKG obtained which contained a lot of artifact. QRS complexes were evenly spaced. Patient visited PCP within the past month. He can easily climb ladder without chest pain. Will proceed with MAC anesthesia for cataract at this time. Reviewed reasons for patient to seek medical attention.)        Anesthesia Quick  Evaluation

## 2021-09-29 NOTE — H&P (Signed)
.[place]   Primary Care Physician:  Tracie Harrier, MD Ophthalmologist: Dr. Merleen Nicely  Pre-Procedure History & Physical: HPI:  Bryan Cline is a 81 y.o. male here for cataract surgery.   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 Authorizing Provider  naproxen (NAPROSYN) 250 MG tablet Take by mouth 2 (two) times daily with a meal.   Yes [provider]  simvastatin (ZOCOR) 40 MG tablet Take 40 mg by mouth daily.   Yes [provider]  amLODipine (NORVASC) 10 MG tablet Take 10 mg by mouth daily. Patient not taking: Reported on 09/21/2021    [provider]  losartan (COZAAR) 100 MG tablet Take 100 mg by mouth daily. Patient not taking: Reported on 09/21/2021    [provider]  meloxicam (MOBIC) 15 MG tablet Take 15 mg by mouth daily. Patient not taking: Reported on 09/21/2021    [provider]  Multiple Vitamin (MULTIVITAMIN) tablet Take 1 tablet by mouth daily. Patient not taking: Reported on 09/21/2021    [provider]    Allergies as of 09/04/2021   (No Known Allergies)    History reviewed. No pertinent family history.  Social History   Socioeconomic History   Marital status: Married    Spouse name: Not on file   Number of children: Not on file   Years of education: Not on file   Highest education level: Not on file  Occupational History   Not on file  Tobacco Use   Smoking  status: Never   Smokeless tobacco: Never  Substance and Sexual Activity   Alcohol use: Yes    Alcohol/week: 7.0 standard drinks    Types: 7 Glasses of wine per week   Drug use: No   Sexual activity: Not on file  Other Topics Concern   Not on file  Social History Narrative   Not on file   Social Determinants of Health   Financial Resource Strain: Not on file  Food Insecurity: Not on file  Transportation Needs: Not on file  Physical Activity: Not on file  Stress: Not on file  Social Connections: Not on file  Intimate Partner Violence: Not on file    Review of Systems: See HPI, otherwise negative ROS  Physical Exam: BP (!) 174/96    Pulse 86    Temp (!) 97.5 F (36.4 C) (Temporal)    Resp 16    Ht 6' (1.829 m)    Wt 91.2 kg    SpO2 99%    BMI 27.26 kg/m  General:   Alert, cooperative in NAD Head:  Normocephalic and atraumatic. Respiratory:  Normal work of breathing. Cardiovascular:  RRR  Impression/Plan: Bryan Cline is here for cataract surgery.  Risks, benefits, limitations, and alternatives regarding cataract surgery have been reviewed with the patient.  Questions have been answered.  All parties agreeable.   Birder Robson, MD  09/29/2021, 1:47 PM

## 2021-09-29 NOTE — Op Note (Addendum)
PREOPERATIVE DIAGNOSIS:  Nuclear sclerotic cataract of the left eye.   POSTOPERATIVE DIAGNOSIS:  Nuclear sclerotic cataract of the left eye.   OPERATIVE PROCEDURE:ORPROCALL@   SURGEON:  Deberah Pelton, MD.   ANESTHESIA:  Anesthesiologist: Fletcher Anon, MD CRNA: Maree Krabbe, CRNA  1.      Managed anesthesia care. 2.     0.36ml of Shugarcaine was instilled following the paracentesis   COMPLICATIONS:  None.   TECHNIQUE:   Stop and chop   DESCRIPTION OF PROCEDURE:  The patient was examined and consented in the preoperative holding area where the aforementioned topical anesthesia was applied to the left eye and then brought back to the Operating Room where the left eye was prepped and draped in the usual sterile ophthalmic fashion and a lid speculum was placed. A paracentesis was created with the side port blade and the anterior chamber was filled with viscoelastic. A near clear corneal incision was performed with the steel keratome. A continuous curvilinear capsulorrhexis was performed with a cystotome followed by the capsulorrhexis forceps. Hydrodissection and hydrodelineation were carried out with BSS on a blunt cannula. The lens was removed in a stop and chop  technique and the remaining cortical material was removed with the irrigation-aspiration handpiece. The capsular bag was inflated with viscoelastic and the Technis DIB00 lens was placed in the capsular bag without complication. The remaining viscoelastic was removed from the eye with the irrigation-aspiration handpiece. The wounds were hydrated. The anterior chamber was flushed with BSS and the eye was inflated to physiologic pressure. 0.24ml Vigamox was placed in the anterior chamber. The wounds were found to be water tight. The eye was dressed with Combigan. The patient was given protective glasses to wear throughout the day and a shield with which to sleep tonight. The patient was also given drops with which to begin a drop regimen today  and will follow-up with me in one day. Implant Name Type Inv. Item Serial No. Manufacturer Lot No. LRB No. Used Action  LENS IOL TECNIS EYHANCE 18.5 - [id number] Intraocular Lens LENS IOL TECNIS EYHANCE 18.5 [id number] [employer]  Left 1 Implanted    Procedure(s): CATARACT EXTRACTION PHACO AND INTRAOCULAR LENS PLACEMENT (IOC) LEFT 11.75 01:23.4 (Left)  I was present, in the OR, for the entirety of the operation.  Electronically signed: Galen Manila 09/29/2021 2:40 PM

## 2021-09-29 NOTE — Anesthesia Postprocedure Evaluation (Signed)
Anesthesia Post Note  Patient: Bryan Cline  Procedure(s) Performed: CATARACT EXTRACTION PHACO AND INTRAOCULAR LENS PLACEMENT (IOC) LEFT 11.75 01:23.4 (Left: Eye)     Patient location during evaluation: PACU Anesthesia Type: MAC Level of consciousness: awake and alert Pain management: pain level controlled Vital Signs Assessment: post-procedure vital signs reviewed and stable Respiratory status: spontaneous breathing and nonlabored ventilation Cardiovascular status: blood pressure returned to baseline Postop Assessment: no apparent nausea or vomiting Anesthetic complications: no   During cataract procedure patient appeared to go into atrial fibrillation without RVR. In PACU, patient denied chest pain or palpitations. Discussed likely new diagnosis of atrial fibrillation with patient and spouse. Patient's spouse will call PCP this afternoon to schedule an appointment this week in order for patient to be evaluated. Patient deferred ED at this time. Given HR in 80's and no other concerning symptoms I believe this is reasonable. Reviewed reasons for patient to seek emergent medical care with patient and spouse. All questions answered.  No notable events documented.  Donavon Kimrey [employer]

## 2021-09-29 NOTE — Anesthesia Procedure Notes (Signed)
Procedure Name: MAC Date/Time: 09/29/2021 1:58 PM Performed by: Cameron Ali, CRNA Pre-anesthesia Checklist: Patient identified, Emergency Drugs available, Suction available, Timeout performed and Patient being monitored Patient Re-evaluated:Patient Re-evaluated prior to induction Oxygen Delivery Method: Nasal cannula Placement Confirmation: positive ETCO2

## 2021-09-29 NOTE — Transfer of Care (Signed)
Immediate Anesthesia Transfer of Care Note  Patient: Bryan Cline  Procedure(s) Performed: CATARACT EXTRACTION PHACO AND INTRAOCULAR LENS PLACEMENT (IOC) LEFT 11.75 01:23.4 (Left: Eye)  Patient Location: PACU  Anesthesia Type: MAC  Level of Consciousness: awake, alert  and patient cooperative  Airway and Oxygen Therapy: Patient Spontanous Breathing and Patient connected to supplemental oxygen  Post-op Assessment: Post-op Vital signs reviewed, Patient's Cardiovascular Status Stable, Respiratory Function Stable, Patent Airway and No signs of Nausea or vomiting  Post-op Vital Signs: Reviewed and stable  Complications: No notable events documented.

## 2021-09-30 ENCOUNTER — Encounter: Payer: Self-pay | Admitting: Ophthalmology

## 2021-09-30 DIAGNOSIS — I10 Essential (primary) hypertension: Secondary | ICD-10-CM | POA: Diagnosis not present

## 2021-09-30 DIAGNOSIS — R739 Hyperglycemia, unspecified: Secondary | ICD-10-CM | POA: Diagnosis not present

## 2021-09-30 DIAGNOSIS — I4891 Unspecified atrial fibrillation: Secondary | ICD-10-CM | POA: Diagnosis not present

## 2021-09-30 DIAGNOSIS — Z9842 Cataract extraction status, left eye: Secondary | ICD-10-CM | POA: Diagnosis not present

## 2021-10-01 ENCOUNTER — Encounter: Payer: Self-pay | Admitting: *Deleted

## 2021-10-08 NOTE — Discharge Instructions (Signed)

## 2021-10-13 ENCOUNTER — Ambulatory Visit: Payer: PPO | Admitting: Anesthesiology

## 2021-10-13 ENCOUNTER — Encounter: Payer: Self-pay | Admitting: Ophthalmology

## 2021-10-13 ENCOUNTER — Ambulatory Visit
Admission: RE | Admit: 2021-10-13 | Discharge: 2021-10-13 | Disposition: A | Discharge status: Home / Self Care | Payer: PPO | Attending: Ophthalmology | Admitting: Ophthalmology

## 2021-10-13 ENCOUNTER — Other Ambulatory Visit: Payer: Self-pay

## 2021-10-13 ENCOUNTER — Encounter
Admission: RE | Disposition: A | Discharge status: Home / Self Care | Payer: Self-pay | Source: Home / Self Care | Attending: Ophthalmology

## 2021-10-13 DIAGNOSIS — E785 Hyperlipidemia, unspecified: Secondary | ICD-10-CM | POA: Diagnosis not present

## 2021-10-13 DIAGNOSIS — H2511 Age-related nuclear cataract, right eye: Secondary | ICD-10-CM | POA: Insufficient documentation

## 2021-10-13 DIAGNOSIS — H25811 Combined forms of age-related cataract, right eye: Secondary | ICD-10-CM | POA: Diagnosis not present

## 2021-10-13 DIAGNOSIS — Z791 Long term (current) use of non-steroidal anti-inflammatories (NSAID): Secondary | ICD-10-CM | POA: Diagnosis not present

## 2021-10-13 DIAGNOSIS — I10 Essential (primary) hypertension: Secondary | ICD-10-CM | POA: Insufficient documentation

## 2021-10-13 DIAGNOSIS — Z79899 Other long term (current) drug therapy: Secondary | ICD-10-CM | POA: Diagnosis not present

## 2021-10-13 DIAGNOSIS — E1136 Type 2 diabetes mellitus with diabetic cataract: Secondary | ICD-10-CM | POA: Insufficient documentation

## 2021-10-13 HISTORY — PX: CATARACT EXTRACTION W/PHACO: SHX586

## 2021-10-13 SURGERY — PHACOEMULSIFICATION, CATARACT, WITH IOL INSERTION
Anesthesia: Monitor Anesthesia Care | Site: Eye | Laterality: Right

## 2021-10-13 MED ORDER — ARMC OPHTHALMIC DILATING DROPS
1.0000 "application " | OPHTHALMIC | Status: AC | PRN
  Administered 2021-10-13 (×3): 1 via OPHTHALMIC

## 2021-10-13 MED ORDER — TETRACAINE HCL 0.5 % OP SOLN
1.0000 [drp] | OPHTHALMIC | Status: AC | PRN
  Administered 2021-10-13 (×3): 1 [drp] via OPHTHALMIC

## 2021-10-13 MED ORDER — SIGHTPATH DOSE#1 BSS IO SOLN
INTRAOCULAR | Status: DC | PRN
  Administered 2021-10-13: 106 mL via OPHTHALMIC

## 2021-10-13 MED ORDER — FENTANYL CITRATE (PF) 100 MCG/2ML IJ SOLN
INTRAMUSCULAR | Status: DC | PRN
  Administered 2021-10-13: 50 ug via INTRAVENOUS

## 2021-10-13 MED ORDER — MOXIFLOXACIN HCL 0.5 % OP SOLN
OPHTHALMIC | Status: DC | PRN
  Administered 2021-10-13: 0.2 mL via OPHTHALMIC

## 2021-10-13 MED ORDER — SIGHTPATH DOSE#1 NA HYALUR & NA CHOND-NA HYALUR IO KIT
PACK | INTRAOCULAR | Status: DC | PRN
  Administered 2021-10-13: 1 via OPHTHALMIC

## 2021-10-13 MED ORDER — BRIMONIDINE TARTRATE-TIMOLOL 0.2-0.5 % OP SOLN
OPHTHALMIC | Status: DC | PRN
  Administered 2021-10-13: 1 [drp] via OPHTHALMIC

## 2021-10-13 MED ORDER — MIDAZOLAM HCL 2 MG/2ML IJ SOLN
INTRAMUSCULAR | Status: DC | PRN
  Administered 2021-10-13: 1 mg via INTRAVENOUS
  Administered 2021-10-13: .5 mg via INTRAVENOUS

## 2021-10-13 MED ORDER — LACTATED RINGERS IV SOLN: INTRAVENOUS | Status: DC

## 2021-10-13 MED ORDER — SIGHTPATH DOSE#1 BSS IO SOLN
INTRAOCULAR | Status: DC | PRN
Start: 2021-10-13 — End: 2021-10-13
  Administered 2021-10-13: 15 mL

## 2021-10-13 MED ORDER — LIDOCAINE HCL (PF) 2 % IJ SOLN
INTRAOCULAR | Status: DC | PRN
  Administered 2021-10-13: 1 mL via INTRAOCULAR

## 2021-10-13 MED ORDER — TETRACAINE 0.5 % OP SOLN OPTIME - NO CHARGE
OPHTHALMIC | Status: DC | PRN
Start: 2021-10-13 — End: 2021-10-13
  Administered 2021-10-13: 1 [drp] via OPHTHALMIC

## 2021-10-13 SURGICAL SUPPLY — 16 items
CATARACT SUITE SIGHTPATH (MISCELLANEOUS) ×2 IMPLANT
DISSECTOR HYDRO NUCLEUS 50X22 (MISCELLANEOUS) ×2 IMPLANT
DRSG TEGADERM 2-3/8X2-3/4 SM (GAUZE/BANDAGES/DRESSINGS) ×2 IMPLANT
FEE CATARACT SUITE SIGHTPATH (MISCELLANEOUS) ×1 IMPLANT
GLOVE SURG GAMMEX PI TX LF 7.5 (GLOVE) ×2 IMPLANT
GLOVE SURG SYN 8.5  E (GLOVE) ×2
GLOVE SURG SYN 8.5 E (GLOVE) ×1 IMPLANT
GLOVE SURG SYN 8.5 PF PI (GLOVE) ×1 IMPLANT
LENS IOL TECNIS EYHANCE 18.5 (Intraocular Lens) ×1 IMPLANT
NDL FILTER BLUNT 18X1 1/2 (NEEDLE) ×1 IMPLANT
NEEDLE FILTER BLUNT 18X 1/2SAF (NEEDLE) ×1
NEEDLE FILTER BLUNT 18X1 1/2 (NEEDLE) ×1 IMPLANT
SYR 3ML LL SCALE MARK (SYRINGE) ×2 IMPLANT
SYR 5ML LL (SYRINGE) ×2 IMPLANT
TIP IRRIGATON/ASPIRATION (MISCELLANEOUS) ×1 IMPLANT
WATER STERILE IRR 250ML POUR (IV SOLUTION) ×2 IMPLANT

## 2021-10-13 NOTE — Anesthesia Procedure Notes (Signed)
Procedure Name: MAC Date/Time: 10/13/2021 1:59 PM Performed by: Mayme Genta, CRNA Pre-anesthesia Checklist: Patient identified, Emergency Drugs available, Suction available, Timeout performed and Patient being monitored Patient Re-evaluated:Patient Re-evaluated prior to induction Oxygen Delivery Method: Nasal cannula Placement Confirmation: positive ETCO2

## 2021-10-13 NOTE — Anesthesia Preprocedure Evaluation (Signed)
Anesthesia Evaluation  Patient identified by MRN, date of birth, ID band Patient awake    Reviewed: Allergy & Precautions, H&P , NPO status , Patient's Chart, lab work & pertinent test results, reviewed documented beta blocker date and time   Airway Mallampati: II  TM Distance: >3 FB Neck ROM: full    Dental no notable dental hx.    Pulmonary neg pulmonary ROS,    Pulmonary exam normal breath sounds clear to auscultation       Cardiovascular Exercise Tolerance: Good hypertension, Normal cardiovascular exam Rhythm:regular Rate:Normal     Neuro/Psych negative neurological ROS  negative psych ROS   GI/Hepatic negative GI ROS, Neg liver ROS,   Endo/Other  diabetes  Renal/GU negative Renal ROS  negative genitourinary   Musculoskeletal   Abdominal   Peds  Hematology negative hematology ROS (+)   Anesthesia Other Findings   Reproductive/Obstetrics negative OB ROS                             Anesthesia Physical Anesthesia Plan  ASA: 2  Anesthesia Plan: MAC   Post-op Pain Management:    Induction:   PONV Risk Score and Plan:   Airway Management Planned:   Additional Equipment:   Intra-op Plan:   Post-operative Plan:   Informed Consent: I have reviewed the patients History and Physical, chart, labs and discussed the procedure including the risks, benefits and alternatives for the proposed anesthesia with the patient or authorized representative who has indicated his/her understanding and acceptance.     Dental Advisory Given  Plan Discussed with: CRNA and Anesthesiologist  Anesthesia Plan Comments:         Anesthesia Quick Evaluation

## 2021-10-13 NOTE — Anesthesia Postprocedure Evaluation (Signed)
Anesthesia Post Note  Patient: Bryan Cline  Procedure(s) Performed: CATARACT EXTRACTION PHACO AND INTRAOCULAR LENS PLACEMENT (IOC) RIGHT 10.09 01:26.8 (Right: Eye)     Patient location during evaluation: PACU Anesthesia Type: MAC Level of consciousness: awake and alert Pain management: pain level controlled Vital Signs Assessment: post-procedure vital signs reviewed and stable Respiratory status: spontaneous breathing, nonlabored ventilation, respiratory function stable and patient connected to nasal cannula oxygen Cardiovascular status: stable and blood pressure returned to baseline Postop Assessment: no apparent nausea or vomiting Anesthetic complications: no   No notable events documented.  Trecia Rogers

## 2021-10-13 NOTE — Transfer of Care (Signed)
Immediate Anesthesia Transfer of Care Note  Patient: Bryan Cline  Procedure(s) Performed: CATARACT EXTRACTION PHACO AND INTRAOCULAR LENS PLACEMENT (IOC) RIGHT 10.09 01:26.8 (Right: Eye)  Patient Location: PACU  Anesthesia Type: MAC  Level of Consciousness: awake, alert  and patient cooperative  Airway and Oxygen Therapy: Patient Spontanous Breathing and Patient connected to supplemental oxygen  Post-op Assessment: Post-op Vital signs reviewed, Patient's Cardiovascular Status Stable, Respiratory Function Stable, Patent Airway and No signs of Nausea or vomiting  Post-op Vital Signs: Reviewed and stable  Complications: No notable events documented.

## 2021-10-13 NOTE — H&P (Signed)
[name]   Primary Care Physician:  Tracie Harrier, MD Ophthalmologist: Dr. Merleen Nicely  Pre-Procedure History & Physical: HPI:  Bryan Cline is a 81 y.o. male here for cataract surgery.   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   Diabetes mellitus without complication ([place])    Hyperlipemia    Hypertension     Past Surgical History:  Procedure Laterality Date   CATARACT EXTRACTION W/PHACO Left 09/29/2021   Procedure: CATARACT EXTRACTION PHACO AND INTRAOCULAR LENS PLACEMENT (IOC) LEFT 11.75 01:23.4;  Surgeon: Birder Robson, MD;  Location: [place];  Service: Ophthalmology;  Laterality: Left;   COLONOSCOPY WITH PROPOFOL N/A 09/10/2016   Procedure: COLONOSCOPY WITH PROPOFOL;  Surgeon: Lollie Sails, MD;  Location: [facility] ENDOSCOPY;  Service: Endoscopy;  Laterality: N/A;   KNEE SURGERY Right    SUPERFICIAL LYMPH NODE BIOPSY / EXCISION Left 2006   TONSILLECTOMY      Prior to Admission medications   Medication Sig Start Date End Date Taking? Authorizing Provider  naproxen (NAPROSYN) 250 MG tablet Take by mouth 2 (two) times daily with a meal.   Yes [provider]  simvastatin (ZOCOR) 40 MG tablet Take 40 mg by mouth daily.   Yes [provider]  amLODipine (NORVASC) 10 MG tablet Take 10 mg by mouth daily. Patient not taking: Reported on 09/21/2021    [provider]  losartan (COZAAR) 100 MG tablet Take 100 mg by mouth daily. Patient not taking: Reported on 09/21/2021    [provider]  meloxicam (MOBIC) 15 MG tablet Take 15 mg by mouth daily. Patient not taking: Reported on 09/21/2021    [provider]  Multiple Vitamin (MULTIVITAMIN) tablet Take 1 tablet by mouth daily. Patient not taking: Reported on 09/21/2021    [provider]    Allergies as of 09/04/2021   (No Known Allergies)    History reviewed. No pertinent family history.  Social History    Socioeconomic History   Marital status: Married    Spouse name: Not on file   Number of children: Not on file   Years of education: Not on file   Highest education level: Not on file  Occupational History   Not on file  Tobacco Use   Smoking status: Never   Smokeless tobacco: Never  Substance and Sexual Activity   Alcohol use: Yes    Alcohol/week: 7.0 standard drinks    Types: 7 Glasses of wine per week   Drug use: No   Sexual activity: Not on file  Other Topics Concern   Not on file  Social History Narrative   Not on file   Social Determinants of Health   Financial Resource Strain: Not on file  Food Insecurity: Not on file  Transportation Needs: Not on file  Physical Activity: Not on file  Stress: Not on file  Social Connections: Not on file  Intimate Partner Violence: Not on file    Review of Systems: See HPI, otherwise negative ROS  Physical Exam: BP (!) 177/96    Pulse 98    Temp 97.9 F (36.6 C) (Temporal)    Resp 16    Ht 6' (1.829 m)    Wt 91.2 kg    SpO2 99%    BMI 27.26 kg/m  General:   Alert, cooperative in NAD Head:  Normocephalic and atraumatic. Respiratory:  Normal work of breathing. Cardiovascular:  RRR  Impression/Plan: Bryan Cline is here  for cataract surgery.  Risks, benefits, limitations, and alternatives regarding cataract surgery have been reviewed with the patient.  Questions have been answered.  All parties agreeable.   Birder Robson, MD  10/13/2021, 1:46 PM

## 2021-10-13 NOTE — Op Note (Signed)
PREOPERATIVE DIAGNOSIS:  Nuclear sclerotic cataract of the right eye.   POSTOPERATIVE DIAGNOSIS:  H25.11 Cataract   OPERATIVE PROCEDURE:ORPROCALL@   SURGEON:  Merleen Nicely, MD.   ANESTHESIA:  Anesthesiologist: Rochel Brome, MD CRNA: Mayme Genta, CRNA  1.      Managed anesthesia care. 2.      0.67ml of Shugarcaine was instilled in the eye following the paracentesis.   COMPLICATIONS:  None.   TECHNIQUE:   Stop and chop   DESCRIPTION OF PROCEDURE:  The patient was examined and consented in the preoperative holding area where the aforementioned topical anesthesia was applied to the right eye and then brought back to the Operating Room where the right eye was prepped and draped in the usual sterile ophthalmic fashion and a lid speculum was placed. A paracentesis was created with the side port blade and the anterior chamber was filled with viscoelastic. A near clear corneal incision was performed with the steel keratome. A continuous curvilinear capsulorrhexis was performed with a cystotome followed by the capsulorrhexis forceps. Hydrodissection and hydrodelineation were carried out with BSS on a blunt cannula. The lens was removed in a stop and chop  technique and the remaining cortical material was removed with the irrigation-aspiration handpiece. The capsular bag was inflated with viscoelastic and the Technis DIB00  lens was placed in the capsular bag without complication. The remaining viscoelastic was removed from the eye with the irrigation-aspiration handpiece. The wounds were hydrated. The anterior chamber was flushed with BSS and the eye was inflated to physiologic pressure. 0.45ml of Vigamox was placed in the anterior chamber. The wounds were found to be water tight. The eye was dressed with Combigan. The patient was given protective glasses to wear throughout the day and a shield with which to sleep tonight. The patient was also given drops with which to begin a drop regimen today and will  follow-up with me in one day. Implant Name Type Inv. Item Serial No. Manufacturer Lot No. LRB No. Used Action  LENS IOL TECNIS EYHANCE 18.5 - [id number] Intraocular Lens LENS IOL TECNIS EYHANCE 18.5 [id number] [employer]  Right 1 Implanted   Procedure(s): CATARACT EXTRACTION PHACO AND INTRAOCULAR LENS PLACEMENT (IOC) RIGHT 10.09 01:26.8 (Right)  Electronically signed: Birder Robson 10/13/2021 2:36 PM

## 2021-10-14 DIAGNOSIS — H2511 Age-related nuclear cataract, right eye: Secondary | ICD-10-CM | POA: Diagnosis not present

## 2021-11-30 DIAGNOSIS — Z961 Presence of intraocular lens: Secondary | ICD-10-CM | POA: Diagnosis not present

## 2022-03-25 DIAGNOSIS — R739 Hyperglycemia, unspecified: Secondary | ICD-10-CM | POA: Diagnosis not present

## 2022-03-25 DIAGNOSIS — E7800 Pure hypercholesterolemia, unspecified: Secondary | ICD-10-CM | POA: Diagnosis not present

## 2022-03-25 DIAGNOSIS — M1711 Unilateral primary osteoarthritis, right knee: Secondary | ICD-10-CM | POA: Diagnosis not present

## 2022-03-25 DIAGNOSIS — I4891 Unspecified atrial fibrillation: Secondary | ICD-10-CM | POA: Diagnosis not present

## 2022-03-25 DIAGNOSIS — Z96651 Presence of right artificial knee joint: Secondary | ICD-10-CM | POA: Diagnosis not present

## 2022-04-01 DIAGNOSIS — I482 Chronic atrial fibrillation, unspecified: Secondary | ICD-10-CM | POA: Diagnosis not present

## 2022-04-01 DIAGNOSIS — R739 Hyperglycemia, unspecified: Secondary | ICD-10-CM | POA: Diagnosis not present

## 2022-04-01 DIAGNOSIS — I358 Other nonrheumatic aortic valve disorders: Secondary | ICD-10-CM | POA: Diagnosis not present

## 2022-04-01 DIAGNOSIS — Z96651 Presence of right artificial knee joint: Secondary | ICD-10-CM | POA: Diagnosis not present

## 2022-04-01 DIAGNOSIS — Z0000 Encounter for general adult medical examination without abnormal findings: Secondary | ICD-10-CM | POA: Diagnosis not present

## 2022-04-01 DIAGNOSIS — M1711 Unilateral primary osteoarthritis, right knee: Secondary | ICD-10-CM | POA: Diagnosis not present

## 2022-04-01 DIAGNOSIS — E7800 Pure hypercholesterolemia, unspecified: Secondary | ICD-10-CM | POA: Diagnosis not present

## 2022-09-29 DIAGNOSIS — I358 Other nonrheumatic aortic valve disorders: Secondary | ICD-10-CM | POA: Diagnosis not present

## 2022-09-29 DIAGNOSIS — R739 Hyperglycemia, unspecified: Secondary | ICD-10-CM | POA: Diagnosis not present

## 2022-09-29 DIAGNOSIS — E7800 Pure hypercholesterolemia, unspecified: Secondary | ICD-10-CM | POA: Diagnosis not present

## 2022-09-29 DIAGNOSIS — Z0000 Encounter for general adult medical examination without abnormal findings: Secondary | ICD-10-CM | POA: Diagnosis not present

## 2022-09-29 DIAGNOSIS — Z125 Encounter for screening for malignant neoplasm of prostate: Secondary | ICD-10-CM | POA: Diagnosis not present

## 2022-09-29 DIAGNOSIS — Z96651 Presence of right artificial knee joint: Secondary | ICD-10-CM | POA: Diagnosis not present

## 2022-10-06 DIAGNOSIS — I482 Chronic atrial fibrillation, unspecified: Secondary | ICD-10-CM | POA: Diagnosis not present

## 2022-10-06 DIAGNOSIS — Z96651 Presence of right artificial knee joint: Secondary | ICD-10-CM | POA: Diagnosis not present

## 2022-10-06 DIAGNOSIS — E7800 Pure hypercholesterolemia, unspecified: Secondary | ICD-10-CM | POA: Diagnosis not present

## 2022-10-06 DIAGNOSIS — R739 Hyperglycemia, unspecified: Secondary | ICD-10-CM | POA: Diagnosis not present

## 2022-10-06 DIAGNOSIS — Z0000 Encounter for general adult medical examination without abnormal findings: Secondary | ICD-10-CM | POA: Diagnosis not present

## 2022-10-06 DIAGNOSIS — C9030 Solitary plasmacytoma not having achieved remission: Secondary | ICD-10-CM | POA: Diagnosis not present

## 2023-03-31 DIAGNOSIS — E7800 Pure hypercholesterolemia, unspecified: Secondary | ICD-10-CM | POA: Diagnosis not present

## 2023-03-31 DIAGNOSIS — Z96651 Presence of right artificial knee joint: Secondary | ICD-10-CM | POA: Diagnosis not present

## 2023-03-31 DIAGNOSIS — C9030 Solitary plasmacytoma not having achieved remission: Secondary | ICD-10-CM | POA: Diagnosis not present

## 2023-03-31 DIAGNOSIS — R739 Hyperglycemia, unspecified: Secondary | ICD-10-CM | POA: Diagnosis not present

## 2023-03-31 DIAGNOSIS — I482 Chronic atrial fibrillation, unspecified: Secondary | ICD-10-CM | POA: Diagnosis not present

## 2023-04-07 DIAGNOSIS — R202 Paresthesia of skin: Secondary | ICD-10-CM | POA: Diagnosis not present

## 2023-04-07 DIAGNOSIS — I358 Other nonrheumatic aortic valve disorders: Secondary | ICD-10-CM | POA: Diagnosis not present

## 2023-04-07 DIAGNOSIS — I482 Chronic atrial fibrillation, unspecified: Secondary | ICD-10-CM | POA: Diagnosis not present

## 2023-04-07 DIAGNOSIS — R634 Abnormal weight loss: Secondary | ICD-10-CM | POA: Diagnosis not present

## 2023-04-07 DIAGNOSIS — I10 Essential (primary) hypertension: Secondary | ICD-10-CM | POA: Diagnosis not present

## 2023-04-07 DIAGNOSIS — Z96651 Presence of right artificial knee joint: Secondary | ICD-10-CM | POA: Diagnosis not present

## 2023-10-05 DIAGNOSIS — I482 Chronic atrial fibrillation, unspecified: Secondary | ICD-10-CM | POA: Diagnosis not present

## 2023-10-05 DIAGNOSIS — R202 Paresthesia of skin: Secondary | ICD-10-CM | POA: Diagnosis not present

## 2023-10-05 DIAGNOSIS — I358 Other nonrheumatic aortic valve disorders: Secondary | ICD-10-CM | POA: Diagnosis not present

## 2023-10-05 DIAGNOSIS — Z125 Encounter for screening for malignant neoplasm of prostate: Secondary | ICD-10-CM | POA: Diagnosis not present

## 2023-10-05 DIAGNOSIS — R7309 Other abnormal glucose: Secondary | ICD-10-CM | POA: Diagnosis not present

## 2023-10-05 DIAGNOSIS — I10 Essential (primary) hypertension: Secondary | ICD-10-CM | POA: Diagnosis not present

## 2023-10-05 DIAGNOSIS — Z96651 Presence of right artificial knee joint: Secondary | ICD-10-CM | POA: Diagnosis not present

## 2023-10-05 DIAGNOSIS — R634 Abnormal weight loss: Secondary | ICD-10-CM | POA: Diagnosis not present

## 2023-10-12 DIAGNOSIS — I358 Other nonrheumatic aortic valve disorders: Secondary | ICD-10-CM | POA: Diagnosis not present

## 2023-10-12 DIAGNOSIS — Z0000 Encounter for general adult medical examination without abnormal findings: Secondary | ICD-10-CM | POA: Diagnosis not present

## 2023-10-12 DIAGNOSIS — E7800 Pure hypercholesterolemia, unspecified: Secondary | ICD-10-CM | POA: Diagnosis not present

## 2023-10-12 DIAGNOSIS — R739 Hyperglycemia, unspecified: Secondary | ICD-10-CM | POA: Diagnosis not present

## 2023-10-12 DIAGNOSIS — C9030 Solitary plasmacytoma not having achieved remission: Secondary | ICD-10-CM | POA: Diagnosis not present

## 2023-10-12 DIAGNOSIS — I482 Chronic atrial fibrillation, unspecified: Secondary | ICD-10-CM | POA: Diagnosis not present

## 2024-03-22 DIAGNOSIS — Z961 Presence of intraocular lens: Secondary | ICD-10-CM | POA: Diagnosis not present

## 2024-04-03 DIAGNOSIS — R739 Hyperglycemia, unspecified: Secondary | ICD-10-CM | POA: Diagnosis not present

## 2024-04-03 DIAGNOSIS — Z0000 Encounter for general adult medical examination without abnormal findings: Secondary | ICD-10-CM | POA: Diagnosis not present

## 2024-04-03 DIAGNOSIS — C9030 Solitary plasmacytoma not having achieved remission: Secondary | ICD-10-CM | POA: Diagnosis not present

## 2024-04-03 DIAGNOSIS — E7800 Pure hypercholesterolemia, unspecified: Secondary | ICD-10-CM | POA: Diagnosis not present

## 2024-04-03 DIAGNOSIS — I482 Chronic atrial fibrillation, unspecified: Secondary | ICD-10-CM | POA: Diagnosis not present

## 2024-04-03 DIAGNOSIS — I358 Other nonrheumatic aortic valve disorders: Secondary | ICD-10-CM | POA: Diagnosis not present

## 2024-04-24 ENCOUNTER — Emergency Department
Admission: EM | Admit: 2024-04-24 | Discharge: 2024-04-24 | Disposition: A | Discharge status: Home / Self Care | Attending: Emergency Medicine | Admitting: Emergency Medicine

## 2024-04-24 ENCOUNTER — Other Ambulatory Visit: Payer: Self-pay

## 2024-04-24 ENCOUNTER — Emergency Department

## 2024-04-24 DIAGNOSIS — S01112A Laceration without foreign body of left eyelid and periocular area, initial encounter: Secondary | ICD-10-CM | POA: Diagnosis not present

## 2024-04-24 DIAGNOSIS — M4310 Spondylolisthesis, site unspecified: Secondary | ICD-10-CM | POA: Diagnosis not present

## 2024-04-24 DIAGNOSIS — E119 Type 2 diabetes mellitus without complications: Secondary | ICD-10-CM | POA: Insufficient documentation

## 2024-04-24 DIAGNOSIS — S0993XA Unspecified injury of face, initial encounter: Secondary | ICD-10-CM | POA: Diagnosis present

## 2024-04-24 DIAGNOSIS — S80212A Abrasion, left knee, initial encounter: Secondary | ICD-10-CM | POA: Insufficient documentation

## 2024-04-24 DIAGNOSIS — Y92513 Shop (commercial) as the place of occurrence of the external cause: Secondary | ICD-10-CM | POA: Diagnosis not present

## 2024-04-24 DIAGNOSIS — S0990XA Unspecified injury of head, initial encounter: Secondary | ICD-10-CM | POA: Diagnosis not present

## 2024-04-24 DIAGNOSIS — S0181XA Laceration without foreign body of other part of head, initial encounter: Secondary | ICD-10-CM

## 2024-04-24 DIAGNOSIS — M4802 Spinal stenosis, cervical region: Secondary | ICD-10-CM | POA: Diagnosis not present

## 2024-04-24 DIAGNOSIS — I6782 Cerebral ischemia: Secondary | ICD-10-CM | POA: Diagnosis not present

## 2024-04-24 DIAGNOSIS — R220 Localized swelling, mass and lump, head: Secondary | ICD-10-CM | POA: Diagnosis not present

## 2024-04-24 DIAGNOSIS — M47812 Spondylosis without myelopathy or radiculopathy, cervical region: Secondary | ICD-10-CM | POA: Diagnosis not present

## 2024-04-24 DIAGNOSIS — I10 Essential (primary) hypertension: Secondary | ICD-10-CM | POA: Diagnosis not present

## 2024-04-24 DIAGNOSIS — T148XXA Other injury of unspecified body region, initial encounter: Secondary | ICD-10-CM

## 2024-04-24 DIAGNOSIS — Z043 Encounter for examination and observation following other accident: Secondary | ICD-10-CM | POA: Diagnosis not present

## 2024-04-24 DIAGNOSIS — W010XXA Fall on same level from slipping, tripping and stumbling without subsequent striking against object, initial encounter: Secondary | ICD-10-CM | POA: Diagnosis not present

## 2024-04-24 DIAGNOSIS — S0081XA Abrasion of other part of head, initial encounter: Secondary | ICD-10-CM | POA: Diagnosis not present

## 2024-04-24 MED ORDER — LIDOCAINE-EPINEPHRINE (PF) 1 %-1:200000 IJ SOLN
10.0000 mL | Freq: Once | INTRAMUSCULAR | Status: AC
  Administered 2024-04-24: 10 mL
  Filled 2024-04-24: qty 30

## 2024-04-24 NOTE — ED Triage Notes (Signed)
 Pt arrives via POV with c/o a head laceration above the left eye after a fall while chasing a grocery cart. Bleeding is controlled at this time. Pt is A&Ox4 and ambulatory during triage.

## 2024-04-24 NOTE — Discharge Instructions (Signed)
Do not get the sutured area wet for 24 hours. After 24 hours, shower/bathe as usual and pat the area dry. Change the bandage 2 times per day and apply antibiotic ointment. Leave open to air when at no risk of getting the area dirty, but cover at night before bed. See your PCP or go to Urgent Care in 7 days for suture removal or sooner for signs or concern of infection.  

## 2024-04-24 NOTE — ED Provider Notes (Signed)
 [facility] Provider Note    Event Date/Time   First MD Initiated Contact with Patient 04/24/24 1246     (approximate)   History   Head Laceration   HPI  Bryan Cline is a 83 y.o. male with history of diabetes, hypertension, hyperlipidemia and as listed in EMR presents to the emergency department for treatment and evaluation after mechanical, nonsyncopal fall resulting in a laceration above the left eye. His shopping cart rolled away from him as he was unloading his groceries at his apartment and he was chasing it. He tripped and fell face first on the concrete. Laceration over the left eye, abrasions to the face, and abrasion to the left knee. Tdap is current.     Physical Exam    Vitals:   04/24/24 1231  BP: (!) 173/106  Pulse: (!) 104  Resp: 16  Temp: 97.9 F (36.6 C)  SpO2: 97%    General: Awake, no distress.  CV:  Good peripheral perfusion.  Resp:  Normal effort.  Abd:  No distention.  Other:  Laceration above the left eyebrow, bleeding controlled.   ED Results / Procedures / Treatments   Labs (all labs ordered are listed, but only abnormal results are displayed)  Labs Reviewed - No data to display   EKG  Not indicated.   RADIOLOGY  Image and radiology report reviewed and interpreted by me. Radiology report consistent with the same.  CT negative for acute concerns  PROCEDURES:  Critical Care performed: No  .Laceration Repair  Date/Time: 04/25/2024 10:45 AM  Performed by: Herlinda Kirk NOVAK, FNP Authorized by: Herlinda Kirk NOVAK, FNP   Consent:    Consent obtained:  Verbal   Consent given by:  Patient   Risks discussed:  Poor cosmetic result and poor wound healing Universal protocol:    Patient identity confirmed:  Verbally with patient Anesthesia:    Anesthesia method:  Local infiltration   Local anesthetic:  Lidocaine  1% WITH epi Laceration details:    Location:  Face   Face location:  L eyebrow   Length  (cm):  2 Pre-procedure details:    Preparation:  Patient was prepped and draped in usual sterile fashion Treatment:    Area cleansed with:  Chlorhexidine and saline   Irrigation method:  Syringe   Layers/structures repaired:  Deep subcutaneous Deep subcutaneous:    Suture size:  6-0   Suture material:  Monocryl   Suture technique:  Running   Number of sutures:  2 Skin repair:    Repair method:  Sutures   Suture size:  5-0   Suture material:  Nylon   Suture technique:  Simple interrupted   Number of sutures:  4 Approximation:    Approximation:  Close Repair type:    Repair type:  Complex Post-procedure details:    Dressing:  Non-adherent dressing   Procedure completion:  Tolerated well, no immediate complications    MEDICATIONS ORDERED IN ED:  Medications  lidocaine -EPINEPHrine  (PF) (XYLOCAINE -EPINEPHrine ) 1 %-1:200000 (PF) injection 10 mL (10 mLs Infiltration Given 04/24/24 1315)     IMPRESSION / MDM / ASSESSMENT AND PLAN / ED COURSE   I have reviewed the triage note and vital signs. Vital signs indicate hypertension and mild tachycardia--patient asymptomatic and likely situational.   Differential diagnosis includes, but is not limited to, skin laceration, abrasion, orbital fracture, intracranial hemorrhage, cervical vertebral injury  Patient's presentation is most consistent with acute presentation with potential threat to life or bodily function.  83 year old male presenting to the emergency department for mechanical, nonsyncopal fall.  History of A-fib history is noted however the patient denies current anticoagulant use.  On exam, patient is awake, alert, oriented.  He does have a laceration above the left eyebrow with bleeding controlled.  Plan will be to get CT imaging of the head and cervical spine and repair the laceration.  Patient aware and agreeable to the plan.      FINAL CLINICAL IMPRESSION(S) / ED DIAGNOSES   Final diagnoses:  Minor head injury,  initial encounter  Laceration of forehead, initial encounter  Abrasion     Rx / DC Orders   ED Discharge Orders     None        Note:  This document was prepared using Dragon voice recognition software and may include unintentional dictation errors.   Herlinda Kirk NOVAK, FNP 04/25/24 1048    Waymond Lorelle Cummins, MD 04/25/24 [phone]

## 2024-04-24 NOTE — ED Notes (Signed)
 See triage note  Presents with laceration over left eye  State she fell while chasing a shopping cart  Ambulates well to treatment room

## 2024-04-26 DIAGNOSIS — H26493 Other secondary cataract, bilateral: Secondary | ICD-10-CM | POA: Diagnosis not present

## 2024-04-26 DIAGNOSIS — H18413 Arcus senilis, bilateral: Secondary | ICD-10-CM | POA: Diagnosis not present

## 2024-04-26 DIAGNOSIS — H02831 Dermatochalasis of right upper eyelid: Secondary | ICD-10-CM | POA: Diagnosis not present

## 2024-04-26 DIAGNOSIS — Z961 Presence of intraocular lens: Secondary | ICD-10-CM | POA: Diagnosis not present

## 2024-05-10 DIAGNOSIS — H26492 Other secondary cataract, left eye: Secondary | ICD-10-CM | POA: Diagnosis not present
